# Patient Record
Sex: Male | Born: 2000 | Marital: Single | Smoking: Never smoker
Health system: Southern US, Community
[De-identification: ages and names within clinical notes are randomized; demographics above are authoritative.]

## PROBLEM LIST (undated history)

## (undated) DIAGNOSIS — R569 Unspecified convulsions: Secondary | ICD-10-CM

## (undated) DIAGNOSIS — F840 Autistic disorder: Secondary | ICD-10-CM

## (undated) DIAGNOSIS — G43909 Migraine, unspecified, not intractable, without status migrainosus: Secondary | ICD-10-CM

## (undated) DIAGNOSIS — F32A Depression, unspecified: Secondary | ICD-10-CM

## (undated) HISTORY — DX: Unspecified convulsions: R56.9

## (undated) HISTORY — DX: Migraine, unspecified, not intractable, without status migrainosus: G43.909

## (undated) HISTORY — DX: Autistic disorder: F84.0

## (undated) HISTORY — PX: TYMPANOSTOMY TUBE PLACEMENT: SHX32

## (undated) HISTORY — PX: OTHER SURGICAL HISTORY: SHX169

## (undated) HISTORY — PX: CIRCUMCISION: SHX1350

## (undated) HISTORY — DX: Depression, unspecified: F32.A

---
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
## 2022-08-08 ENCOUNTER — Ambulatory Visit (INDEPENDENT_AMBULATORY_CARE_PROVIDER_SITE_OTHER): Payer: Medicaid Other | Admitting: Nurse Practitioner

## 2022-08-08 ENCOUNTER — Encounter: Payer: Self-pay | Admitting: Nurse Practitioner

## 2022-08-08 VITALS — BP 122/84 | HR 91 | Temp 97.5°F | Ht 66.5 in | Wt 149.1 lb

## 2022-08-08 DIAGNOSIS — R1013 Epigastric pain: Secondary | ICD-10-CM

## 2022-08-08 DIAGNOSIS — Z0001 Encounter for general adult medical examination with abnormal findings: Secondary | ICD-10-CM

## 2022-08-08 DIAGNOSIS — Z13220 Encounter for screening for lipoid disorders: Secondary | ICD-10-CM | POA: Diagnosis not present

## 2022-08-08 DIAGNOSIS — R112 Nausea with vomiting, unspecified: Secondary | ICD-10-CM | POA: Diagnosis not present

## 2022-08-08 DIAGNOSIS — Z0000 Encounter for general adult medical examination without abnormal findings: Secondary | ICD-10-CM

## 2022-08-08 LAB — CBC
HCT: 47.3 % (ref 39.0–52.0)
Hemoglobin: 16.2 g/dL (ref 13.0–17.0)
MCHC: 34.3 g/dL (ref 30.0–36.0)
MCV: 85.6 fl (ref 78.0–100.0)
Platelets: 269 10*3/uL (ref 150.0–400.0)
RBC: 5.52 Mil/uL (ref 4.22–5.81)
RDW: 13.3 % (ref 11.5–14.6)
WBC: 6.6 10*3/uL (ref 4.5–10.5)

## 2022-08-08 LAB — COMPREHENSIVE METABOLIC PANEL
ALT: 11 U/L (ref 0–53)
AST: 18 U/L (ref 0–37)
Albumin: 4.9 g/dL (ref 3.5–5.2)
Alkaline Phosphatase: 137 U/L — ABNORMAL HIGH (ref 39–117)
BUN: 11 mg/dL (ref 6–23)
CO2: 30 mEq/L (ref 19–32)
Calcium: 9.9 mg/dL (ref 8.4–10.5)
Chloride: 102 mEq/L (ref 96–112)
Creatinine, Ser: 1.01 mg/dL (ref 0.40–1.50)
GFR: 107.15 mL/min (ref >60.00)
Glucose, Bld: 83 mg/dL (ref 70–99)
Potassium: 3.9 mEq/L (ref 3.5–5.1)
Sodium: 139 mEq/L (ref 135–145)
Total Bilirubin: 1 mg/dL (ref 0.2–1.2)
Total Protein: 7.9 g/dL (ref 6.0–8.3)

## 2022-08-08 LAB — TSH: TSH: 1.84 u[IU]/mL (ref 0.35–5.50)

## 2022-08-08 LAB — LIPID PANEL
Cholesterol: 178 mg/dL (ref 0–200)
HDL: 35.4 mg/dL — ABNORMAL LOW (ref >39.00)
LDL Cholesterol: 103 mg/dL — ABNORMAL HIGH (ref 0–99)
NonHDL: 142.2
Total CHOL/HDL Ratio: 5
Triglycerides: 197 mg/dL — ABNORMAL HIGH (ref 0.0–149.0)
VLDL: 39.4 mg/dL (ref 0.0–40.0)

## 2022-08-08 LAB — LIPASE: Lipase: 17 U/L (ref 11.0–59.0)

## 2022-08-08 MED ORDER — OMEPRAZOLE 20 MG PO CPDR: 20.0000 mg | DELAYED_RELEASE_CAPSULE | Freq: Every day | ORAL | 3 refills | Status: DC

## 2022-08-08 NOTE — Assessment & Plan Note (Signed)
Ambiguous in nature has improved.  Patient does deal with heartburn and use omeprazole on medication.  Will start patient on omeprazole 20 mg daily for the next 4 months.  Pending labs today

## 2022-08-08 NOTE — Progress Notes (Signed)
New Patient Office Visit  Subjective    Patient ID: Cory Lester, male    DOB: 2001-02-01  Age: 22 y.o. MRN: [id number]  CC:  Chief Complaint  Patient presents with   New Patient (Initial Visit)    HPI Cory Lester presents to establish care  for complete physical and follow up of chronic conditions.   Heartburn: prolsec that he will use intermittently  Immunizations: -Tetanus: Completed in 2016 -Influenza: refused  -Shingles: Too young -Pneumonia: Too young  Diet: Fair diet. 2 meals a day with some snacks. States that he will drink soda and sweet tea.  Exercise:  Walk 3 days a week at least 30 mins.   Eye exam: . Glasses needs updating Dental exam: needs updating   Colonoscopy: Too young, currently average risk Lung Cancer Screening: N/A  PSA: Too young, currently average risk  Sleep: states that when he was in schooll 10-5. Sttes currently it depends on schedule. Feels rested. States that he has snore in the past     H: Home with parents and 3 siblings that are younger E: [employer] for [employer] with [employer] and history. Going ot [employer] school in fall  A: Reading and church and politics D: no illect drugs S:never been on medication has done talk therapy in the past.  Patient states he does have passive SI at times no concrete plan or active thoughts of wanting to harm himself.  Strong family history of mental illness patient plans on enrolling in therapy when he starts graduate school in the fall S: not currenlty sexually acitve  States that he does not drive.  Has the test scheduled in  August   Outpatient Encounter Medications as of 08/08/2022  Medication Sig   omeprazole (PRILOSEC) 20 MG capsule Take 1 capsule (20 mg total) by mouth daily.   No facility-administered encounter medications on file as of 08/08/2022.    Past Medical History:  Diagnosis Date   Autism spectrum disorder    Depression    Migraines    Seizure (HCC)     Past  Surgical History:  Procedure Laterality Date   CIRCUMCISION     OTHER SURGICAL HISTORY     Cyst removed from right side of face   TYMPANOSTOMY TUBE PLACEMENT      Family History  Problem Relation Age of Onset   Depression Mother    Anxiety disorder Mother    Kidney Stones Mother    Mental illness Mother    Learning disabilities Father    Depression Father    Mental illness Father    ADD / ADHD Sister    Depression Brother    Mental illness Brother    Crohn's disease Maternal Aunt    Hypertension Maternal Grandfather    Depression Paternal Grandmother    Alcohol abuse Paternal Grandfather    Depression Paternal Grandfather     Social History   Socioeconomic History   Marital status: Single    Spouse name: Not on file   Number of children: 0   Years of education: Not on file   Highest education level: Bachelor's degree (e.g., BA, AB, BS)  Occupational History   Not on file  Tobacco Use   Smoking status: Never   Smokeless tobacco: Never  Vaping Use   Vaping Use: Never used  Substance and Sexual Activity   Alcohol use: No   Drug use: No   Sexual activity: Never  Other Topics Concern   Not  on file  Social History Narrative   [employer] degree. Law school in fall. [employer]      Lives at home with parents and 3 siblings      Read as a Economist Strain: Not on file  Food Insecurity: Not on file  Transportation Needs: Not on file  Physical Activity: Not on file  Stress: Not on file  Social Connections: Not on file  Intimate Partner Violence: Not on file    Review of Systems  Constitutional:  Negative for chills and fever.  Respiratory:  Negative for shortness of breath.   Cardiovascular:  Negative for chest pain and leg swelling.  Gastrointestinal:  Positive for vomiting (states that he pukes on a weekly basis). Negative for abdominal pain, blood in stool, constipation, diarrhea and nausea.       BM daily    Genitourinary:  Negative for dysuria and hematuria.  Neurological:  Negative for tingling and headaches.  Psychiatric/Behavioral:  Negative for hallucinations and suicidal ideas.         Objective    BP 122/84   Pulse 91   Temp (!) 97.5 F (36.4 C) (Temporal)   Ht 5' 6.5" (1.689 m)   Wt 149 lb 2 oz (67.6 kg)   SpO2 99%   BMI 23.71 kg/m   Physical Exam Vitals and nursing note reviewed. Exam conducted with a chaperone present Cory Lester, CMA).  Constitutional:      Appearance: Normal appearance.  HENT:     Right Ear: Tympanic membrane, ear canal and external ear normal.     Left Ear: Tympanic membrane, ear canal and external ear normal.     Mouth/Throat:     Mouth: Mucous membranes are moist.     Pharynx: Oropharynx is clear.  Eyes:     Extraocular Movements: Extraocular movements intact.     Pupils: Pupils are equal, round, and reactive to light.  Cardiovascular:     Rate and Rhythm: Normal rate and regular rhythm.     Pulses: Normal pulses.     Heart sounds: Normal heart sounds.  Pulmonary:     Effort: Pulmonary effort is normal.     Breath sounds: Normal breath sounds.  Abdominal:     General: Bowel sounds are normal. There is no distension.     Palpations: There is no mass.     Tenderness: There is no abdominal tenderness.     Hernia: No hernia is present. There is no hernia in the left inguinal area or right inguinal area.  Genitourinary:    Penis: Normal.      Testes: Normal.     Epididymis:     Right: Normal.     Left: Normal.  Musculoskeletal:     Right lower leg: No edema.     Left lower leg: No edema.  Lymphadenopathy:     Cervical: No cervical adenopathy.     Lower Body: No right inguinal adenopathy. No left inguinal adenopathy.  Skin:    General: Skin is warm.  Neurological:     General: No focal deficit present.     Mental Status: He is alert.     Deep Tendon Reflexes:     Reflex Scores:      Bicep reflexes are 2+ on the right side and 2+  on the left side.      Patellar reflexes are 2+ on the right side and 2+ on the left side.  Comments: Bilateral upper and lower extremity strength 5/5  Psychiatric:        Mood and Affect: Mood normal.        Behavior: Behavior normal.        Thought Content: Thought content normal.        Judgment: Judgment normal.         Assessment & Plan:   Problem List Items Addressed This Visit       Digestive   Nausea and vomiting    Ambiguous in nature has improved.  Patient does deal with heartburn and use omeprazole on medication.  Will start patient on omeprazole 20 mg daily for the next 4 months.  Pending labs today      Relevant Medications   omeprazole (PRILOSEC) 20 MG capsule   Other Relevant Orders   CBC   Comprehensive metabolic panel   Lipase     Other   Preventative health care - Primary    Discussed age-appropriate immunizations and screening exams.  Did review patient's personal, surgical, social, family histories.  Patient is up-to-date on all age-appropriate immunizations that he would like.  Patient is too young for CRC screening or prostate cancer screening.  Patient is not sexually active so no need for STI testing.  Patient was given information at discharge about preventative healthcare maintenance with anticipatory guidance      Relevant Orders   CBC   Comprehensive metabolic panel   TSH   Epigastric pain    Exam benign in office today.  Will check basic labs inclusive of lipase.  Start patient on omeprazole 20 mg daily for the next 4 months      Relevant Orders   Lipase   Other Visit Diagnoses     Screening for lipid disorders       Relevant Orders   Lipid panel       Return in about 1 year (around 08/08/2023) for CPE and Labs.   Audria Nine, NP

## 2022-08-08 NOTE — Patient Instructions (Signed)
Nice to see you today I will be in touch with the labs once I have reviewed them Follow up with me in 1 year, sooner if you need me  

## 2022-08-08 NOTE — Assessment & Plan Note (Signed)
Exam benign in office today.  Will check basic labs inclusive of lipase.  Start patient on omeprazole 20 mg daily for the next 4 months

## 2022-08-08 NOTE — Assessment & Plan Note (Signed)
Discussed age-appropriate immunizations and screening exams.  Did review patient's personal, surgical, social, family histories.  Patient is up-to-date on all age-appropriate immunizations that he would like.  Patient is too young for CRC screening or prostate cancer screening.  Patient is not sexually active so no need for STI testing.  Patient was given information at discharge about preventative healthcare maintenance with anticipatory guidance

## 2023-07-31 ENCOUNTER — Ambulatory Visit (INDEPENDENT_AMBULATORY_CARE_PROVIDER_SITE_OTHER): Payer: MEDICAID | Admitting: Nurse Practitioner

## 2023-07-31 ENCOUNTER — Encounter: Payer: Self-pay | Admitting: Nurse Practitioner

## 2023-07-31 ENCOUNTER — Ambulatory Visit: Payer: MEDICAID | Admitting: Family Medicine

## 2023-07-31 VITALS — BP 108/80 | HR 92 | Temp 98.2°F | Ht 66.5 in | Wt 152.0 lb

## 2023-07-31 DIAGNOSIS — R112 Nausea with vomiting, unspecified: Secondary | ICD-10-CM

## 2023-07-31 DIAGNOSIS — R1013 Epigastric pain: Secondary | ICD-10-CM

## 2023-07-31 DIAGNOSIS — R252 Cramp and spasm: Secondary | ICD-10-CM

## 2023-07-31 DIAGNOSIS — R519 Headache, unspecified: Secondary | ICD-10-CM

## 2023-07-31 MED ORDER — SUCRALFATE 1 G PO TABS: 1.0000 g | ORAL_TABLET | Freq: Three times a day (TID) | ORAL | 0 refills | Status: DC

## 2023-07-31 NOTE — Assessment & Plan Note (Signed)
 Avoid NSAIDs encouraged tylenol use if needed. Encouraged adequate hydration and nutrition

## 2023-07-31 NOTE — Assessment & Plan Note (Addendum)
 Query anxiety vs stress ulcer. Will r/o H pylori and do basic labs. Avoid nsaids and alcohol. Start Carafate 1g QID. Referral to GI  Has tried and failed omeprazole 

## 2023-07-31 NOTE — Progress Notes (Signed)
 Acute Office Visit  Subjective:     Patient ID: Cory Lester, male    DOB: [birth year], 23 y.o.   MRN: [id number]  Chief Complaint  Patient presents with   Anxiety    Have vomiting spells mainly when traveling but have been frequent lately; a couple of times a week; think I may need referral to [facility]; was taking prilosec but only helped a little so I stopped it    HPI Patient is in today for nausea, vomiting/anxiety.  With a history of The same, headaches, epigastric pain.  Patient has been on omeprazole  daily in the past  Patients grandmother is at bedside and provides some history for the HPI  Of note patient was seen at [facility] student health for vomiting and nausea.  He normally throws up 1-2 times a week but did previous week when he was seen he vomited at least 5 times and been going on for approximately 4 years at that point currently in graduate school for law.  Patient was not given any medication.  They recommended continue omeprazole  daily with a bland diet.  He has been followed by GI in the past but this is when he was considered pediatric.  States that he is throwing up a lot. Approx 1-2 times a week. States that [place] up. States that he has thrown up several times when he is at [employer]. States that he has stopped eating to prevent from vomiting. He has not tried anything over the counter. He has tried omeprazole  that helped some and has been off it for months. He does have some nausea, epigastric pain and then he will vomit.   He does take a lot of advil. He does have daily headaches daily that he will take advil for it to abate it and will get it to go away No alcohol or smoking States that he did pass out approx 1-2 months ago prior to eam.  He will drink sweet tea or grape drink. He does not drink water.   Review of Systems  Constitutional:  Negative for chills and fever.  Respiratory:  Negative for  shortness of breath.   Cardiovascular:  Negative for chest pain.  Gastrointestinal:  Positive for abdominal pain, nausea and vomiting. Negative for constipation and diarrhea.       BM  daily   Neurological:  Positive for dizziness, tingling and headaches. Negative for weakness.        Objective:    BP 108/80   Pulse 92   Temp 98.2 F (36.8 C)   Ht 5' 6.5 (1.689 m)   Wt 152 lb (68.9 kg)   SpO2 98%   BMI 24.17 kg/m  BP Readings from Last 3 Encounters:  07/31/23 108/80  08/08/22 122/84  01/05/18 (!) 110/64 (42%, Z = -0.20 /  50%, Z = 0.00)*   *BP percentiles are based on the 2017 AAP Clinical Practice Guideline for boys   Wt Readings from Last 3 Encounters:  07/31/23 152 lb (68.9 kg)  08/08/22 149 lb 2 oz (67.6 kg)  01/05/18 129 lb 6.4 oz (58.7 kg) (45%, Z= -0.13)*   * Growth percentiles are based on CDC (Boys, 2-20 Years) data.   SpO2 Readings from Last 3 Encounters:  07/31/23 98%  08/08/22 99%  08/11/16 100%      Physical Exam Vitals and nursing note reviewed.  Constitutional:      Appearance: Normal appearance.  HENT:     Mouth/Throat:     Mouth: Mucous membranes are moist.     Pharynx: Oropharynx is clear.   Eyes:     Extraocular Movements: Extraocular movements intact.     Pupils: Pupils are equal, round, and reactive to light.   Neck:     Vascular: No carotid bruit.   Cardiovascular:     Rate and Rhythm: Normal rate and regular rhythm.     Heart sounds: Normal heart sounds.  Pulmonary:     Effort: Pulmonary effort is normal.     Breath sounds: Normal breath sounds.  Abdominal:     General: There is no distension.     Palpations: Abdomen is soft. There is no mass.     Tenderness: There is abdominal tenderness in the epigastric area.     Hernia: No hernia is present.  Lymphadenopathy:     Cervical: No cervical adenopathy.   Neurological:     Mental Status: He is alert.     No results found for any visits on 07/31/23.      Assessment &  Plan:   Problem List Items Addressed This Visit       Digestive   Nausea and vomiting - Primary   Query anxiety vs stress ulcer. Will r/o H pylori and do basic labs. Avoid nsaids and alcohol. Start Carafate 1g QID      Relevant Orders   CBC   Comprehensive metabolic panel with GFR   Ambulatory referral to Gastroenterology     Other   Chronic daily headache   Avoid NSAIDs encouraged tylenol use if needed. Encouraged adequate hydration and nutrition       Relevant Orders   TSH   Epigastric pain   Query anxiety vs stress ulcer. Will r/o H pylori and do basic labs. Avoid nsaids and alcohol. Start Carafate 1g QID. Referral to GI  Has tried and failed omeprazole        Relevant Medications   sucralfate (CARAFATE) 1 g tablet   Other Relevant Orders   CBC   Comprehensive metabolic panel with GFR   H. pylori breath test   Ambulatory referral to Gastroenterology   Other Visit Diagnoses       Muscle cramping       Relevant Orders   Comprehensive metabolic panel with GFR   TSH   Magnesium       Meds ordered this encounter  Medications   sucralfate (CARAFATE) 1 g tablet    Sig: Take 1 tablet (1 g total) by mouth 4 (four) times daily -  with meals and at bedtime for 10 days.    Dispense:  40 tablet    Refill:  0    Supervising Provider:   Deri Fleet A [[id number]]    Return in about 2 weeks (around 08/14/2023) for Anxiety/episgastric pain .  Cory Shay, NP

## 2023-07-31 NOTE — Patient Instructions (Signed)
 Nice to see you today Avoid NSAIDS like Ibuprofen, Motrin, Aleve, Naproxen, BC/Goody powders   Tylenol is ok to take  Drink 64 oz of water a day

## 2023-07-31 NOTE — Assessment & Plan Note (Signed)
 Query anxiety vs stress ulcer. Will r/o H pylori and do basic labs. Avoid nsaids and alcohol. Start Carafate 1g QID

## 2023-08-01 LAB — CBC
HCT: 43.7 % (ref 38.5–50.0)
Hemoglobin: 15.2 g/dL (ref 13.2–17.1)
MCH: 30.4 pg (ref 27.0–33.0)
MCHC: 34.8 g/dL (ref 32.0–36.0)
MCV: 87.4 fL (ref 80.0–100.0)
MPV: 10.4 fL (ref 7.5–12.5)
Platelets: 264 10*3/uL (ref 140–400)
RBC: 5 10*6/uL (ref 4.20–5.80)
RDW: 12.5 % (ref 11.0–15.0)
WBC: 6.6 10*3/uL (ref 3.8–10.8)

## 2023-08-01 LAB — COMPREHENSIVE METABOLIC PANEL WITH GFR
AG Ratio: 1.7 (calc) (ref 1.0–2.5)
ALT: 10 U/L (ref 9–46)
AST: 15 U/L (ref 10–40)
Albumin: 4.6 g/dL (ref 3.6–5.1)
Alkaline phosphatase (APISO): 113 U/L (ref 36–130)
BUN: 12 mg/dL (ref 7–25)
CO2: 26 mmol/L (ref 20–32)
Calcium: 9.4 mg/dL (ref 8.6–10.3)
Chloride: 102 mmol/L (ref 98–110)
Creat: 0.95 mg/dL (ref 0.60–1.24)
Globulin: 2.7 g/dL (ref 1.9–3.7)
Glucose, Bld: 75 mg/dL (ref 65–99)
Potassium: 3.9 mmol/L (ref 3.5–5.3)
Sodium: 139 mmol/L (ref 135–146)
Total Bilirubin: 0.8 mg/dL (ref 0.2–1.2)
Total Protein: 7.3 g/dL (ref 6.1–8.1)
eGFR: 117 mL/min/{1.73_m2} (ref >=60)

## 2023-08-01 LAB — TSH: TSH: 0.98 m[IU]/L (ref 0.40–4.50)

## 2023-08-01 LAB — MAGNESIUM: Magnesium: 1.9 mg/dL (ref 1.5–2.5)

## 2023-08-02 LAB — H. PYLORI BREATH TEST: H. pylori Breath Test: NOT DETECTED

## 2023-08-04 ENCOUNTER — Ambulatory Visit: Payer: Self-pay | Admitting: Nurse Practitioner

## 2023-08-11 ENCOUNTER — Ambulatory Visit (INDEPENDENT_AMBULATORY_CARE_PROVIDER_SITE_OTHER): Payer: MEDICAID | Admitting: Nurse Practitioner

## 2023-08-11 ENCOUNTER — Encounter: Payer: Self-pay | Admitting: Nurse Practitioner

## 2023-08-11 VITALS — BP 106/78 | HR 88 | Temp 98.6°F | Ht 66.5 in | Wt 153.0 lb

## 2023-08-11 DIAGNOSIS — F411 Generalized anxiety disorder: Secondary | ICD-10-CM

## 2023-08-11 DIAGNOSIS — F332 Major depressive disorder, recurrent severe without psychotic features: Secondary | ICD-10-CM | POA: Insufficient documentation

## 2023-08-11 DIAGNOSIS — R112 Nausea with vomiting, unspecified: Secondary | ICD-10-CM

## 2023-08-11 NOTE — Assessment & Plan Note (Signed)
 PHQ-9 administered in office.  And strongly encouraged patient on anxiolytic he politely declined.  Drug of choice would have been Effexor to help with anxiety, depression, headaches.

## 2023-08-11 NOTE — Patient Instructions (Addendum)
 Nice to see you today  [facility] behavioral health urgent care   Address: [address], [place], [place] [address] Phone: [phone] Hours:  Open 24 hours   988 is a Restaurant manager, fast food with resources  Local emergency departments can also get you immediate help

## 2023-08-11 NOTE — Assessment & Plan Note (Signed)
 Patient does have major depressive disorder.  With passive SI.  Patient did contract for safety in office.  Asked several times for patient to go on antidepressant he is reluctant.  Information printed.  Urgent referral to therapy and psychiatry today.  Patient to reach out to behavioral urgent care, [phone], local emergency departments or me if he starts having active SI.  Will reach out to make sure patient has been contacted by referrals.

## 2023-08-11 NOTE — Progress Notes (Signed)
 Established Patient Office Visit  Subjective   Patient ID: Cory Lester, male    DOB: 2001-01-25  Age: 23 y.o. MRN: [id number]  Chief Complaint  Patient presents with   Medical Management of Chronic Issues    Follow up did not notice any change with Carafate . Denies any new symptoms.     HPI  Nausea vomiting/epigastric pain: Patient was seen by me on 07/31/2023.  Patient was placed on Carafate  1 g 4 times daily for 10 days.  Patient had tried and failed omeprazole  in the past.  We did rule out H. pylori.  Basic labs were also negative.  Patient here for follow-up. States that he did not notice much of a differnece. States that [place] and vomited twice. States that the rest of the week he did not vomit. State that he was in bed for the weekend because he felt sick. States that he started feeling better yesterday States that his appetitie is normal, minus the one day.    MDD/GAD: the discussion last week was in regards to anxiety being a part of the stomach issue.  Patient did fill out PHQ-9 and GAD-7 today.  Offered to place patient on anxiolytic patient's t trepidations.  States his mother has been on medications in the past and from the sounds of it did not tolerate them well.  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  Patient states he has done therapy in the past which was beneficial and is interested in doing that.  He does have passive SI but no active SI currently.  He has had that in the past.  No concrete plan.      Review of Systems  Constitutional:  Negative for chills and fever.  Respiratory:  Negative for shortness of breath.   Cardiovascular:  Negative for chest pain.  Gastrointestinal:  Positive for abdominal pain and vomiting.  Neurological:  Negative for headaches.  Psychiatric/Behavioral:  Positive for suicidal ideas (passive). Negative for hallucinations.       Objective:     BP 106/78   Pulse 88   Temp 98.6 F  (37 C) (Oral)   Ht 5' 6.5 (1.689 m)   Wt 153 lb (69.4 kg)   SpO2 98%   BMI 24.32 kg/m  BP Readings from Last 3 Encounters:  08/11/23 106/78  07/31/23 108/80  08/08/22 122/84   Wt Readings from Last 3 Encounters:  08/11/23 153 lb (69.4 kg)  07/31/23 152 lb (68.9 kg)  08/08/22 149 lb 2 oz (67.6 kg)   SpO2 Readings from Last 3 Encounters:  08/11/23 98%  07/31/23 98%  08/08/22 99%      Physical Exam Vitals and nursing note reviewed.  Constitutional:      Appearance: Normal appearance.   Cardiovascular:     Rate and Rhythm: Normal rate and regular rhythm.     Heart sounds: Normal heart sounds.  Pulmonary:     Effort: Pulmonary effort is normal.     Breath sounds: Normal breath sounds.   Neurological:     Mental Status: He is alert.      No results found for any visits on 08/11/23.    The ASCVD Risk score (Arnett DK, et al., 2019) failed to calculate for the following reasons:   The 2019 ASCVD risk score is only valid for ages 7 to 87    Assessment & Plan:   Problem List Items Addressed This Visit  Digestive   Nausea and vomiting   Patient to continue with plan of seeing GI provider.  Patient is not having any weight loss and appetite is normal per his report.        Other   GAD (generalized anxiety disorder)   PHQ-9 administered in office.  And strongly encouraged patient on anxiolytic he politely declined.  Drug of choice would have been Effexor to help with anxiety, depression, headaches.      Relevant Orders   Ambulatory referral to Psychology   Ambulatory referral to Psychiatry   Severe episode of recurrent major depressive disorder, without psychotic features (HCC) - Primary   Patient does have major depressive disorder.  With passive SI.  Patient did contract for safety in office.  Asked several times for patient to go on antidepressant he is reluctant.  Information printed.  Urgent referral to therapy and psychiatry today.  Patient to  reach out to behavioral urgent care, [phone], local emergency departments or me if he starts having active SI.  Will reach out to make sure patient has been contacted by referrals.      Relevant Orders   Ambulatory referral to Psychology   Ambulatory referral to Psychiatry    Return if symptoms worsen or fail to improve.    Adina Crandall, NP

## 2023-08-11 NOTE — Assessment & Plan Note (Signed)
 Patient to continue with plan of seeing GI provider.  Patient is not having any weight loss and appetite is normal per his report.

## 2023-08-20 ENCOUNTER — Telehealth: Payer: Self-pay | Admitting: Nurse Practitioner

## 2023-08-20 NOTE — Telephone Encounter (Signed)
 Left voicemail for patient to call the office back.

## 2023-08-20 NOTE — Telephone Encounter (Signed)
-----   Message from [facility] sent at 08/11/2023  3:49 PM EDT ----- Regarding: MDD/GAD/CPE Call and see how patient is doing.  Has he considered Effexor.  Can get patient in the next 30 days for CPE

## 2023-08-20 NOTE — Telephone Encounter (Signed)
 Call and see how patient is doing.  Has he considered Effexor.  Can we get the patient in the next 30 days for CPE

## 2023-08-21 ENCOUNTER — Telehealth: Payer: Self-pay | Admitting: Nurse Practitioner

## 2023-08-21 ENCOUNTER — Ambulatory Visit (INDEPENDENT_AMBULATORY_CARE_PROVIDER_SITE_OTHER): Payer: MEDICAID | Admitting: Clinical

## 2023-08-21 DIAGNOSIS — F411 Generalized anxiety disorder: Secondary | ICD-10-CM | POA: Diagnosis not present
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
## 2023-08-21 NOTE — Telephone Encounter (Signed)
 PLEASE ADVISE.   Copied from CRM [id number]. Topic: Appointments - Scheduling Inquiry for Clinic >> Aug 20, 2023  3:13 PM Martinique E wrote: Reason for CRM: Tried getting patient scheduled for his CPE within the next 30 days and it was pushing agent out until December 2025 with PCP. Callback number [phone].

## 2023-08-21 NOTE — Telephone Encounter (Signed)
 Left voicemail for patient to call back and schedule CPE office visit.

## 2023-08-25 ENCOUNTER — Telehealth: Payer: Self-pay | Admitting: Nurse Practitioner

## 2023-08-25 NOTE — Telephone Encounter (Signed)
 Left voicemail for patient to call the office back.

## 2023-08-25 NOTE — Telephone Encounter (Signed)
-----   Message from [id number] sent at 08/11/2023  3:39 PM EDT ----- Regarding: MDD/GAD See how he is doing.  Is he willing to do the effexor Has he heard for therapy and psych

## 2023-08-25 NOTE — Telephone Encounter (Signed)
 See how he is doing.  Is he willing to do the effexor  Has he heard for therapy and psych

## 2023-08-28 ENCOUNTER — Ambulatory Visit: Payer: MEDICAID | Admitting: Clinical

## 2023-08-28 DIAGNOSIS — F411 Generalized anxiety disorder: Secondary | ICD-10-CM | POA: Diagnosis not present

## 2023-08-28 NOTE — Progress Notes (Signed)
 Time: 3:00pm-4:00pm Diagnosis: F41.1 CPT Code: 90837P  Cory Lester was seen in person for individual therapy. Session began by creation of a treatment plan. Cory Lester provided verbal input and verbally consented to all goals and interventions. He reported a reduction in suicidal ideation over the past week due to being engaged in and excited about a Human resources officer. He anticipates this will continue through next week. He is scheduled to be seen again in one week.  Treatment Plan Client Abilities/Strengths Cory Lester attends appointments regularly and is able to articulate thoughts and experiences clearly. Client Treatment Preferences  Cory Lester prefers in-person appointments and will need appointments that fit with his school schedule.  Client Statement of Needs  Cory Lester reported that he needs help to manage suicidal ideation, and symptoms of depression and anxiety. Treatment Level  Weekly with reduced frequency as progress is made. Symptoms  Vomiting, suicidal ideation, feelings of sadness, loss of pleasure, lack of motivation, poor self image, inconsistent appetite (sometimes so sad he does not eat), fluctuations in sleep, stomach pain, sporadic leg pain, headaches several times weekly (takes prescription medication for this)  Problems Addressed  Suicidal ideation, symptoms of depression and anxiety Goals 1. Cory Lester would like to improve his overall mood   Objective Reduce frequency and intensity of suicidal ideation Target Date: 08/27/2024 Frequency: Weekly  Progress: 0 Modality: individual  Related Interventions Therapist will help Cory Lester to identify and disengage from negative thought patterns using CBT-based strategies Therapist will provide Cory Lester with opportunities to process her experiences in session Therapist will provide Cory Lester with additional resources as appropriate Therapist will provide Cory Lester with strategies to help her manage her emotions, such as deep breathing, meditation,  mindfulness, and self care Therapist will engage Cory Lester in behavior activation, including pleasant events scheduling, incorporation of structure into his routine, and mastery experiences              Cory LITTIE Ponto, PhD

## 2023-09-02 ENCOUNTER — Ambulatory Visit (INDEPENDENT_AMBULATORY_CARE_PROVIDER_SITE_OTHER): Payer: MEDICAID | Admitting: Clinical

## 2023-09-02 DIAGNOSIS — F411 Generalized anxiety disorder: Secondary | ICD-10-CM

## 2023-09-02 NOTE — Progress Notes (Signed)
 Time: 2:00pm-3:00pm Diagnosis: F41.1 CPT Code: 90837P  Lea was seen in person for individual therapy. He reported a more challenging week, but noted he had not experienced suicidal ideation. He reflected upon a sense that he is outside of society. Therapist engaged him in discussion of this, exploring times when he has felt more connected. He shared that he has felt this way much of his life. For homework, he will explore whether there are political boards/committees/roles he can get involved in at the state level. He is scheduled to be seen again in one week.  Treatment Plan Client Abilities/Strengths Hubbard attends appointments regularly and is able to articulate thoughts and experiences clearly. Client Treatment Preferences  Pasha prefers in-person appointments and will need appointments that fit with his school schedule.  Client Statement of Needs  Rudolph reported that he needs help to manage suicidal ideation, and symptoms of depression and anxiety. Treatment Level  Weekly with reduced frequency as progress is made. Symptoms  Vomiting, suicidal ideation, feelings of sadness, loss of pleasure, lack of motivation, poor self image, inconsistent appetite (sometimes so sad he does not eat), fluctuations in sleep, stomach pain, sporadic leg pain, headaches several times weekly (takes prescription medication for this)  Problems Addressed  Suicidal ideation, symptoms of depression and anxiety Goals 1. Martese would like to improve his overall mood   Objective Reduce frequency and intensity of suicidal ideation Target Date: 08/27/2024 Frequency: Weekly  Progress: 0 Modality: individual  Related Interventions Therapist will help Jaquann to identify and disengage from negative thought patterns using CBT-based strategies Therapist will provide Daanish with opportunities to process her experiences in session Therapist will provide Priest with additional resources as appropriate Therapist will  provide Margues with strategies to help her manage her emotions, such as deep breathing, meditation, mindfulness, and self care Therapist will engage Tavita in behavior activation, including pleasant events scheduling, incorporation of structure into his routine, and mastery experiences              [name], PhD               [name], PhD

## 2023-09-05 ENCOUNTER — Ambulatory Visit: Payer: MEDICAID | Admitting: Clinical

## 2023-09-08 ENCOUNTER — Ambulatory Visit (INDEPENDENT_AMBULATORY_CARE_PROVIDER_SITE_OTHER): Payer: MEDICAID | Admitting: Clinical

## 2023-09-08 DIAGNOSIS — F411 Generalized anxiety disorder: Secondary | ICD-10-CM | POA: Diagnosis not present

## 2023-09-08 NOTE — Progress Notes (Signed)
 Time: 2:00pm-3:00pm Diagnosis: F41.1 CPT Code: 90837P  Cory Lester was seen in person for individual therapy. Session began by processing a request Cory Lester had made at the end of his last session that the therapist ask more questions. He communicated that he would like to better understand the nature of his current challenges. He expressed interest in having the therapist read results from his previous evaluation, as well as in completing an ABAS-3 and BDI. He is scheduled to be seen again in one week.  Treatment Plan Client Abilities/Strengths Cory Lester attends appointments regularly and is able to articulate thoughts and experiences clearly. Client Treatment Preferences  Cory Lester prefers in-person appointments and will need appointments that fit with his school schedule.  Client Statement of Needs  Cory Lester reported that he needs help to manage suicidal ideation, and symptoms of depression and anxiety. Treatment Level  Weekly with reduced frequency as progress is made. Symptoms  Vomiting, suicidal ideation, feelings of sadness, loss of pleasure, lack of motivation, poor self image, inconsistent appetite (sometimes so sad he does not eat), fluctuations in sleep, stomach pain, sporadic leg pain, headaches several times weekly (takes prescription medication for this)  Problems Addressed  Suicidal ideation, symptoms of depression and anxiety Goals 1. Cory Lester would like to improve his overall mood   Objective Reduce frequency and intensity of suicidal ideation Target Date: 08/27/2024 Frequency: Weekly  Progress: 0 Modality: individual  Related Interventions Therapist will help Cory Lester to identify and disengage from negative thought patterns using CBT-based strategies Therapist will provide Cory Lester with opportunities to process her experiences in session Therapist will provide Cory Lester with additional resources as appropriate Therapist will provide Cory Lester with strategies to help her manage her emotions, such  as deep breathing, meditation, mindfulness, and self care Therapist will engage Cory Lester in behavior activation, including pleasant events scheduling, incorporation of structure into his routine, and mastery experiences                Cory LITTIE Ponto, PhD               Cory LITTIE Ponto, PhD

## 2023-09-18 ENCOUNTER — Ambulatory Visit (INDEPENDENT_AMBULATORY_CARE_PROVIDER_SITE_OTHER): Payer: MEDICAID | Admitting: Clinical

## 2023-09-18 DIAGNOSIS — F411 Generalized anxiety disorder: Secondary | ICD-10-CM

## 2023-09-18 NOTE — Progress Notes (Signed)
 Time: 10:00am-11:00am Diagnosis: F41.1 CPT Code: 90837P  Ardean was seen in person for individual therapy. Session focused on reviewing Kizer's ABAS-3 scores and completing the BDI. He obtained a score of 32, placing him in the severe range. Therapist discussed the idea of a more intensive option, including partial hospitalization, and he indicated a plan to consider this. Suicidal ideation and intent are present but Jaylen assured the therapist he will not act on these thoughts due to religious beliefs, and is safe. He is scheduled to be seen again in one week.  Treatment Plan Client Abilities/Strengths Nicolo attends appointments regularly and is able to articulate thoughts and experiences clearly. Client Treatment Preferences  Gunnar prefers in-person appointments and will need appointments that fit with his school schedule.  Client Statement of Needs  Ramadan reported that he needs help to manage suicidal ideation, and symptoms of depression and anxiety. Treatment Level  Weekly with reduced frequency as progress is made. Symptoms  Vomiting, suicidal ideation, feelings of sadness, loss of pleasure, lack of motivation, poor self image, inconsistent appetite (sometimes so sad he does not eat), fluctuations in sleep, stomach pain, sporadic leg pain, headaches several times weekly (takes prescription medication for this)  Problems Addressed  Suicidal ideation, symptoms of depression and anxiety Goals 1. Barlow would like to improve his overall mood   Objective Reduce frequency and intensity of suicidal ideation Target Date: 08/27/2024 Frequency: Weekly  Progress: 0 Modality: individual  Related Interventions Therapist will help Jeorge to identify and disengage from negative thought patterns using CBT-based strategies Therapist will provide Fabyan with opportunities to process her experiences in session Therapist will provide Lopez with additional resources as appropriate Therapist will  provide Talis with strategies to help her manage her emotions, such as deep breathing, meditation, mindfulness, and self care Therapist will engage Watson in behavior activation, including pleasant events scheduling, incorporation of structure into his routine, and mastery experiences               [name], PhD               [name], PhD

## 2023-09-24 ENCOUNTER — Ambulatory Visit (INDEPENDENT_AMBULATORY_CARE_PROVIDER_SITE_OTHER): Payer: MEDICAID | Admitting: Clinical

## 2023-09-24 DIAGNOSIS — F411 Generalized anxiety disorder: Secondary | ICD-10-CM

## 2023-09-24 NOTE — Progress Notes (Signed)
 Time: 2:00 pm-3:00 pm Diagnosis: F41.1 CPT Code: 09162E  Cory Lester was seen in person for individual therapy. Session included reviewing results from his previous evaluation and considering his next best steps. Cory Lester was hesitant at the idea of exploring more intensive treatment for depression. He was open to creating a plan for the next 8 weeks and pursuing more intensive treatment if BDI scores do not decrease. He assured the therapist that he will be safe and will call 911 or go to the emergency room if thoughts of self-harm worsen. He continues to endorse concern about going to hell as a primary barrier. He is scheduled to be seen again in three weeks.  Therapy Agreement Twice weekly therapy appointments  Continue with political interest on a regular basis  I agree to go to the emergency room or call 911 if thoughts of self harm worsen Continue recreational reading on a regular basis Go for regular walks If symptoms do not improve in 8 weeks as evidenced by a decreased BDI score, we will pursue more intensive treatment     Treatment Plan Client Abilities/Strengths Cory Lester attends appointments regularly and is able to articulate thoughts and experiences clearly. Client Treatment Preferences  Cory Lester prefers in-person appointments and will need appointments that fit with his school schedule.  Client Statement of Needs  Cory Lester reported that he needs help to manage suicidal ideation, and symptoms of depression and anxiety. Treatment Level  Weekly with reduced frequency as progress is made. Symptoms  Vomiting, suicidal ideation, feelings of sadness, loss of pleasure, lack of motivation, poor self image, inconsistent appetite (sometimes so sad he does not eat), fluctuations in sleep, stomach pain, sporadic leg pain, headaches several times weekly (takes prescription medication for this)  Problems Addressed  Suicidal ideation, symptoms of depression and anxiety Goals 1. Cory Lester would like to  improve his overall mood   Objective Reduce frequency and intensity of suicidal ideation Target Date: 08/27/2024 Frequency: Weekly  Progress: 0 Modality: individual  Related Interventions Therapist will help Cory Lester to identify and disengage from negative thought patterns using CBT-based strategies Therapist will provide Cory Lester with opportunities to process her experiences in session Therapist will provide Cory Lester with additional resources as appropriate Therapist will provide Cory Lester with strategies to help her manage her emotions, such as deep breathing, meditation, mindfulness, and self care Therapist will engage Cory Lester in behavior activation, including pleasant events scheduling, incorporation of structure into his routine, and mastery experiences               [name], PhD               [name], PhD               [name], PhD

## 2023-09-25 ENCOUNTER — Encounter: Payer: Self-pay | Admitting: Gastroenterology

## 2023-10-14 ENCOUNTER — Ambulatory Visit (INDEPENDENT_AMBULATORY_CARE_PROVIDER_SITE_OTHER): Payer: MEDICAID | Admitting: Clinical

## 2023-10-14 DIAGNOSIS — F411 Generalized anxiety disorder: Secondary | ICD-10-CM

## 2023-10-14 NOTE — Progress Notes (Signed)
 Time: 4:00 pm-5:00 pm Diagnosis: F41.1 CPT Code: 09162E  Cory Lester was seen in person for individual therapy. He reported an increase in depressed mood and suicidal ideation with intent but no plan. Religious beliefs continue to serve as his primary barrier. He had called [facility] and learned that they do not accept his insurance. He indicated a plan to begin receiving additional services through his university's counseling center, with an appointment in two days. He agreed to go to the emergency room if symptoms worsen. Therapist suggested exploring medication as an option, but he expressed hesitation based on ill effects he has observed in family members. He is scheduled to be seen again in one week, and agreed to reach out if he needs to be seen sooner. He is scheduled to be seen again in three weeks.  Therapy Agreement Twice weekly therapy appointments  Continue with political interest on a regular basis  I agree to go to the emergency room or call 911 if thoughts of self harm worsen Continue recreational reading on a regular basis Go for regular walks If symptoms do not improve in 8 weeks as evidenced by a decreased BDI score, we will pursue more intensive treatment     Treatment Plan Client Abilities/Strengths Praveen attends appointments regularly and is able to articulate thoughts and experiences clearly. Client Treatment Preferences  Chett prefers in-person appointments and will need appointments that fit with his school schedule.  Client Statement of Needs  Dontrel reported that he needs help to manage suicidal ideation, and symptoms of depression and anxiety. Treatment Level  Weekly with reduced frequency as progress is made. Symptoms  Vomiting, suicidal ideation, feelings of sadness, loss of pleasure, lack of motivation, poor self image, inconsistent appetite (sometimes so sad he does not eat), fluctuations in sleep, stomach pain, sporadic leg pain, headaches several times  weekly (takes prescription medication for this)  Problems Addressed  Suicidal ideation, symptoms of depression and anxiety Goals 1. Alontae would like to improve his overall mood   Objective Reduce frequency and intensity of suicidal ideation Target Date: 08/27/2024 Frequency: Weekly  Progress: 0 Modality: individual  Related Interventions Therapist will help Roemello to identify and disengage from negative thought patterns using CBT-based strategies Therapist will provide Freddrick with opportunities to process her experiences in session Therapist will provide Chanel with additional resources as appropriate Therapist will provide Bryten with strategies to help her manage her emotions, such as deep breathing, meditation, mindfulness, and self care Therapist will engage Korey in behavior activation, including pleasant events scheduling, incorporation of structure into his routine, and mastery experiences               [name], PhD                    [name], PhD               [name], PhD

## 2023-10-21 ENCOUNTER — Ambulatory Visit (INDEPENDENT_AMBULATORY_CARE_PROVIDER_SITE_OTHER): Payer: MEDICAID | Admitting: Clinical

## 2023-10-21 DIAGNOSIS — F411 Generalized anxiety disorder: Secondary | ICD-10-CM | POA: Diagnosis not present

## 2023-10-21 NOTE — Progress Notes (Signed)
 Time: 2:00 pm-3:00 pm Diagnosis: F41.1 CPT Code: 09162E  Cory Lester was seen in person for individual therapy. He reported some improvement in mood, but suicidal ideation without plan or intent continues in the mornings and evenings. He reported having completed his homework (see agreement below), and had started a petition at school, as as initiated the process to join the state board. Session focused on ways to connect with others. He is scheduled to be seen again in one week, and has an appointment with his school counseling center in two days.  Therapy Agreement Twice weekly therapy appointments  Continue with political interest on a regular basis  I agree to go to the emergency room or call 911 if thoughts of self harm worsen Continue recreational reading on a regular basis Go for regular walks If symptoms do not improve in 8 weeks as evidenced by a decreased BDI score, we will pursue more intensive treatment     Treatment Plan Client Abilities/Strengths Cory Lester attends appointments regularly and is able to articulate thoughts and experiences clearly. Client Treatment Preferences  Cory Lester prefers in-person appointments and will need appointments that fit with his school schedule.  Client Statement of Needs  Cory Lester reported that he needs help to manage suicidal ideation, and symptoms of depression and anxiety. Treatment Level  Weekly with reduced frequency as progress is made. Symptoms  Vomiting, suicidal ideation, feelings of sadness, loss of pleasure, lack of motivation, poor self image, inconsistent appetite (sometimes so sad he does not eat), fluctuations in sleep, stomach pain, sporadic leg pain, headaches several times weekly (takes prescription medication for this)  Problems Addressed  Suicidal ideation, symptoms of depression and anxiety Goals 1. Cory Lester would like to improve his overall mood   Objective Reduce frequency and intensity of suicidal ideation Target Date: 08/27/2024  Frequency: Weekly  Progress: 0 Modality: individual  Related Interventions Therapist will help Cory Lester to identify and disengage from negative thought patterns using CBT-based strategies Therapist will provide Cory Lester with opportunities to process her experiences in session Therapist will provide Cory Lester with additional resources as appropriate Therapist will provide Cory Lester with strategies to help her manage her emotions, such as deep breathing, meditation, mindfulness, and self care Therapist will engage [name] in behavior activation, including pleasant events scheduling, incorporation of structure into his routine, and mastery experiences               Cory LITTIE Ponto, PhD                      Cory LITTIE Ponto, PhD               Cory LITTIE Ponto, PhD

## 2023-10-28 ENCOUNTER — Ambulatory Visit (INDEPENDENT_AMBULATORY_CARE_PROVIDER_SITE_OTHER): Payer: MEDICAID | Admitting: Clinical

## 2023-10-28 DIAGNOSIS — F411 Generalized anxiety disorder: Secondary | ICD-10-CM

## 2023-10-28 NOTE — Progress Notes (Signed)
 Time: 8:00 am-9:00 am Diagnosis: F41.1 CPT Code: 90837P  Cory Lester was seen in person for individual therapy. He reported continued improvement in mood, and some reduction in frequency of suicidal ideation. However, he continues to experience suicidal ideation without plan or intent daily. Session focused on his concerns about continuing his family, and [name] connected his suicidal ideation to fears that he will not able to do so. Therapist encouraged him to reflect further upon his desire to his family and his personal characteristics to continue. Therapist also suggested consulting with his PCP about taking fish oil for mood regulation and to mitigate the risk of psychotic symptoms. He is scheduled to be seen again in one week.   Therapy Agreement Twice weekly therapy appointments  Continue with political interest on a regular basis  I agree to go to the emergency room or call 911 if thoughts of self harm worsen Continue recreational reading on a regular basis Go for regular walks If symptoms do not improve in 8 weeks as evidenced by a decreased BDI score, we will pursue more intensive treatment     Treatment Plan Client Abilities/Strengths [name] attends appointments regularly and is able to articulate thoughts and experiences clearly. Client Treatment Preferences  [name] prefers in-person appointments and will need appointments that fit with his school schedule.  Client Statement of Needs  [name] reported that he needs help to manage suicidal ideation, and symptoms of depression and anxiety. Treatment Level  Weekly with reduced frequency as progress is made. Symptoms  Vomiting, suicidal ideation, feelings of sadness, loss of pleasure, lack of motivation, poor self image, inconsistent appetite (sometimes so sad he does not eat), fluctuations in sleep, stomach pain, sporadic leg pain, headaches several times weekly (takes prescription medication for this)  Problems Addressed  Suicidal  ideation, symptoms of depression and anxiety Goals 1. [name] would like to improve his overall mood   Objective Reduce frequency and intensity of suicidal ideation Target Date: 08/27/2024 Frequency: Weekly  Progress: 0 Modality: individual  Related Interventions Therapist will help [name] to identify and disengage from negative thought patterns using CBT-based strategies Therapist will provide Job with opportunities to process her experiences in session Therapist will provide [name] with additional resources as appropriate Therapist will provide [name] with strategies to help her manage her emotions, such as deep breathing, meditation, mindfulness, and self care Therapist will engage [name] in behavior activation, including pleasant events scheduling, incorporation of structure into his routine, and mastery experiences               [name], PhD                     [name], PhD               [name], PhD

## 2023-11-04 ENCOUNTER — Ambulatory Visit: Payer: MEDICAID | Admitting: Clinical

## 2023-11-04 DIAGNOSIS — F411 Generalized anxiety disorder: Secondary | ICD-10-CM | POA: Diagnosis not present

## 2023-11-04 NOTE — Progress Notes (Signed)
 Time: 9:00 am-9:59 am Diagnosis: F41.1 CPT Code: 90837P  Ashtian was seen in person for individual therapy. He reported continued reduction in suicidal ideation, and several successes over the past week. Therapist continued to explore his outlook for the future and thought patterns with him, and he was open to the idea that he may have a tendency to expect the worst for fear of failure.He is scheduled to be seen again in two weeks and therapist will reach out as cancellations happen..   Therapy Agreement Twice weekly therapy appointments  Continue with political interest on a regular basis  I agree to go to the emergency room or call 911 if thoughts of self harm worsen Continue recreational reading on a regular basis Go for regular walks If symptoms do not improve in 8 weeks as evidenced by a decreased BDI score, we will pursue more intensive treatment     Treatment Plan Client Abilities/Strengths Shubham attends appointments regularly and is able to articulate thoughts and experiences clearly. Client Treatment Preferences  Tanmay prefers in-person appointments and will need appointments that fit with his school schedule.  Client Statement of Needs  Markevion reported that he needs help to manage suicidal ideation, and symptoms of depression and anxiety. Treatment Level  Weekly with reduced frequency as progress is made. Symptoms  Vomiting, suicidal ideation, feelings of sadness, loss of pleasure, lack of motivation, poor self image, inconsistent appetite (sometimes so sad he does not eat), fluctuations in sleep, stomach pain, sporadic leg pain, headaches several times weekly (takes prescription medication for this)  Problems Addressed  Suicidal ideation, symptoms of depression and anxiety Goals 1. Oronde would like to improve his overall mood   Objective Reduce frequency and intensity of suicidal ideation Target Date: 08/27/2024 Frequency: Weekly  Progress: 0 Modality: individual   Related Interventions Therapist will help Eagle to identify and disengage from negative thought patterns using CBT-based strategies Therapist will provide Jerrick with opportunities to process her experiences in session Therapist will provide Marrion with additional resources as appropriate Therapist will provide Kwamaine with strategies to help her manage her emotions, such as deep breathing, meditation, mindfulness, and self care Therapist will engage Crawford in behavior activation, including pleasant events scheduling, incorporation of structure into his routine, and mastery experiences               [name], PhD                          [name], PhD               [name], PhD

## 2023-11-10 NOTE — Progress Notes (Unsigned)
 Chief Complaint:nausea and vomiting, epigastric pain Primary GI Doctor:***  HPI:  Patient is a  23  year old male patient with past medical history of anxiety *****who was referred to me by Wendee Lynwood HERO, NP on 07/31/23 for a evaluation of nausea, vomiting, epigastric pain .    Interval History  Patient admits/denies GERD Patient has been placed on omeprazole  20 mg p.o. daily Patient has been prescribed Carafate  1 g tablet Patient admits/denies dysphagia Patient admits/denies nausea, vomiting, or weight loss  Patient admits/denies altered bowel habits Patient admits/denies abdominal pain Patient admits/denies rectal bleeding   Denies/Admits alcohol Denies/Admits smoking Denies/Admits NSAID use. Patient takes Advil for headaches Denies/Admits they are on blood thinners.  Patients last colonoscopy Patients last EGD  Surgical history:  Patient's family history includes  Wt Readings from Last 3 Encounters:  08/11/23 153 lb (69.4 kg)  07/31/23 152 lb (68.9 kg)  08/08/22 149 lb 2 oz (67.6 kg)      Past Medical History:  Diagnosis Date   Autism spectrum disorder    Depression    Migraines    Seizure (HCC)     Past Surgical History:  Procedure Laterality Date   CIRCUMCISION     OTHER SURGICAL HISTORY     Cyst removed from right side of face   TYMPANOSTOMY TUBE PLACEMENT      Current Outpatient Medications  Medication Sig Dispense Refill   omeprazole  (PRILOSEC) 20 MG capsule Take 1 capsule (20 mg total) by mouth daily. (Patient not taking: Reported on 08/11/2023) 30 capsule 3   sucralfate  (CARAFATE ) 1 g tablet Take 1 tablet (1 g total) by mouth 4 (four) times daily -  with meals and at bedtime for 10 days. 40 tablet 0   No current facility-administered medications for this visit.    Allergies as of 11/11/2023   (No Known Allergies)    Family History  Problem Relation Age of Onset   Depression Mother    Anxiety disorder Mother    Kidney Stones Mother     Mental illness Mother    Learning disabilities Father    Depression Father    Mental illness Father    ADD / ADHD Sister    Depression Brother    Mental illness Brother    Crohn's disease Maternal Aunt    Hypertension Maternal Grandfather    Depression Paternal Grandmother    Alcohol abuse Paternal Grandfather    Depression Paternal Grandfather     Review of Systems:    Constitutional: No weight loss, fever, chills, weakness or fatigue HEENT: Eyes: No change in vision               Ears, Nose, Throat:  No change in hearing or congestion Skin: No rash or itching Cardiovascular: No chest pain, chest pressure or palpitations   Respiratory: No SOB or cough Gastrointestinal: See HPI and otherwise negative Genitourinary: No dysuria or change in urinary frequency Neurological: No headache, dizziness or syncope Musculoskeletal: No new muscle or joint pain Hematologic: No bleeding or bruising Psychiatric: No history of depression or anxiety    Physical Exam:  Vital signs: There were no vitals taken for this visit.  Constitutional:   Pleasant *** male/male appears to be in NAD, Well developed, Well nourished, alert and cooperative Eyes:   PEERL, EOMI. No icterus. Conjunctiva pink. Neck:  Supple Throat: Oral cavity and pharynx without inflammation, swelling or lesion.  Respiratory: Respirations even and unlabored. Lungs clear to auscultation bilaterally.   No  wheezes, crackles, or rhonchi.  Cardiovascular: Normal S1, S2. Regular rate and rhythm. No peripheral edema, cyanosis or pallor.  Gastrointestinal:  Soft, nondistended, nontender. No rebound or guarding. Normal bowel sounds. No appreciable masses or hepatomegaly. Rectal:  Not performed.  Anoscopy: Msk:  Symmetrical without gross deformities. Without edema, no deformity or joint abnormality.  Neurologic:  Alert and  oriented x4;  grossly normal neurologically.  Skin:   Dry and intact without significant lesions or  rashes.  RELEVANT LABS AND IMAGING: CBC    Latest Ref Rng & Units 07/31/2023    3:46 PM 08/08/2022   11:02 AM 08/11/2016   11:11 AM  CBC  WBC 3.8 - 10.8 Thousand/uL 6.6  6.6    Hemoglobin 13.2 - 17.1 g/dL 84.7  83.7  85.3   Hematocrit 38.5 - 50.0 % 43.7  47.3  43.0   Platelets 140 - 400 Thousand/uL 264  269.0       CMP     Latest Ref Rng & Units 07/31/2023    3:46 PM 08/08/2022   11:02 AM 08/11/2016   11:11 AM  CMP  Glucose 65 - 99 mg/dL 75  83  81   BUN 7 - 25 mg/dL 12  11  11    Creatinine 0.60 - 1.24 mg/dL 9.04  8.98  9.39   Sodium 135 - 146 mmol/L 139  139  140   Potassium 3.5 - 5.3 mmol/L 3.9  3.9  3.9   Chloride 98 - 110 mmol/L 102  102  105   CO2 20 - 32 mmol/L 26  30    Calcium 8.6 - 10.3 mg/dL 9.4  9.9    Total Protein 6.1 - 8.1 g/dL 7.3  7.9    Total Bilirubin 0.2 - 1.2 mg/dL 0.8  1.0    Alkaline Phos 39 - 117 U/L  137    AST 10 - 40 U/L 15  18    ALT 9 - 46 U/L 10  11       Lab Results  Component Value Date   TSH 0.98 07/31/2023  07/31/2023 H. pylori breath test negative  Assessment: 1. ***  Plan: 1. ***   Thank you for the courtesy of this consult. Please call me with any questions or concerns.   Alex Leahy, FNP-C [facility] 11/10/2023, 4:08 PM  Cc: Wendee Lynwood HERO, NP

## 2023-11-11 ENCOUNTER — Encounter: Payer: Self-pay | Admitting: Gastroenterology

## 2023-11-11 ENCOUNTER — Ambulatory Visit (INDEPENDENT_AMBULATORY_CARE_PROVIDER_SITE_OTHER): Payer: MEDICAID | Admitting: Gastroenterology

## 2023-11-11 VITALS — BP 116/70 | HR 73 | Ht 66.0 in | Wt 144.2 lb

## 2023-11-11 DIAGNOSIS — R112 Nausea with vomiting, unspecified: Secondary | ICD-10-CM

## 2023-11-11 DIAGNOSIS — R1013 Epigastric pain: Secondary | ICD-10-CM | POA: Diagnosis not present

## 2023-11-11 DIAGNOSIS — R634 Abnormal weight loss: Secondary | ICD-10-CM

## 2023-11-11 DIAGNOSIS — G8929 Other chronic pain: Secondary | ICD-10-CM

## 2023-11-11 MED ORDER — OMEPRAZOLE 20 MG PO CPDR: 20.0000 mg | DELAYED_RELEASE_CAPSULE | Freq: Every day | ORAL | 3 refills | Status: AC

## 2023-11-11 MED ORDER — ONDANSETRON 4 MG PO TBDP: 4.0000 mg | ORAL_TABLET | Freq: Two times a day (BID) | ORAL | 0 refills | Status: AC | PRN

## 2023-11-11 NOTE — Patient Instructions (Signed)
 Nausea and vomiting Ondansetron  as needed for nausea- sent medication to pharmacy Can try ginger chews otc No NSAIDs (advil)- talk with PCP about something for headaches    Abdominal pain Restart omeprazole  20 mg po daily, take 1 tablet 30-45 minutes before breakfast GERD diet, no late meals  You have been scheduled for a CT scan of the abdomen and pelvis at [facility], 1st floor Radiology. You are scheduled on 11/14/23 at 11:00am. You should arrive 15 minutes prior to your appointment time for registration.   You may take any medications as prescribed with a small amount of water, if necessary. If you take any of the following medications: METFORMIN, GLUCOPHAGE, GLUCOVANCE, AVANDAMET, RIOMET, FORTAMET, ACTOPLUS MET, JANUMET, GLUMETZA or METAGLIP, you MAY be asked to HOLD this medication 48 hours AFTER the exam.   The purpose of you drinking the oral contrast is to aid in the visualization of your intestinal tract. The contrast solution may cause some diarrhea. Depending on your individual set of symptoms, you may also receive an intravenous injection of x-ray contrast/dye. Plan on being at [facility] for 45 minutes or longer, depending on the type of exam you are having performed.   If you have any questions regarding your exam or if you need to reschedule, you may call [facility] at [phone] between the hours of 8:00 am and 5:00 pm, Monday-Friday.    You have been scheduled for an endoscopy. Please follow written instructions given to you at your visit today.  If you use inhalers (even only as needed), please bring them with you on the day of your procedure.  If you take any of the following medications, they will need to be adjusted prior to your procedure:   DO NOT TAKE 7 DAYS PRIOR TO TEST- Trulicity (dulaglutide) Ozempic, Wegovy (semaglutide) Mounjaro (tirzepatide) Bydureon Bcise (exanatide extended release)  DO NOT TAKE 1 DAY PRIOR TO YOUR  TEST Rybelsus (semaglutide) Adlyxin (lixisenatide) Victoza (liraglutide) Byetta (exanatide) ___________________________________________________________________________  Due to recent changes in healthcare laws, you may see the results of your imaging and laboratory studies on MyChart before your provider has had a chance to review them.  We understand that in some cases there may be results that are confusing or concerning to you. Not all laboratory results come back in the same time frame and the provider may be waiting for multiple results in order to interpret others.  Please give us  48 hours in order for your provider to thoroughly review all the results before contacting the office for clarification of your results.  _______________________________________________________  If your blood pressure at your visit was 140/90 or greater, please contact your primary care physician to follow up on this.  _______________________________________________________  If you are age 1 or older, your body mass index should be between 23-30. Your Body mass index is 23.28 kg/m. If this is out of the aforementioned range listed, please consider follow up with your Primary Care Provider.  If you are age 19 or younger, your body mass index should be between 19-25. Your Body mass index is 23.28 kg/m. If this is out of the aformentioned range listed, please consider follow up with your Primary Care Provider.   ________________________________________________________  The [facility] providers would like to encourage you to use MYCHART to communicate with providers for non-urgent requests or questions.  Due to long hold times on the telephone, sending your provider a message by [facility] may be a faster and more efficient way to  get a response.  Please allow 48 business hours for a response.  Please remember that this is for non-urgent requests.  _______________________________________________________  [facility] is using a team-based approach to care.  Your team is made up of your doctor and two to three APPS. Our APPS (Nurse Practitioners and Physician Assistants) work with your physician to ensure care continuity for you. They are fully qualified to address your health concerns and develop a treatment plan. They communicate directly with your gastroenterologist to care for you. Seeing the Advanced Practice Practitioners on your physician's team can help you by facilitating care more promptly, often allowing for earlier appointments, access to diagnostic testing, procedures, and other specialty referrals.   Thank you for trusting me with your gastrointestinal care. Deanna May, FNP-C

## 2023-11-14 ENCOUNTER — Ambulatory Visit (HOSPITAL_COMMUNITY): Payer: MEDICAID

## 2023-11-18 ENCOUNTER — Ambulatory Visit (INDEPENDENT_AMBULATORY_CARE_PROVIDER_SITE_OTHER): Payer: MEDICAID | Admitting: Clinical

## 2023-11-18 ENCOUNTER — Ambulatory Visit: Payer: Self-pay | Admitting: Gastroenterology

## 2023-11-18 ENCOUNTER — Ambulatory Visit (HOSPITAL_COMMUNITY)
Admission: RE | Admit: 2023-11-18 | Discharge: 2023-11-18 | Disposition: A | Discharge status: Home / Self Care | Payer: MEDICAID | Source: Ambulatory Visit | Attending: Gastroenterology | Admitting: Gastroenterology

## 2023-11-18 DIAGNOSIS — G8929 Other chronic pain: Secondary | ICD-10-CM | POA: Insufficient documentation

## 2023-11-18 DIAGNOSIS — R112 Nausea with vomiting, unspecified: Secondary | ICD-10-CM | POA: Insufficient documentation

## 2023-11-18 DIAGNOSIS — F411 Generalized anxiety disorder: Secondary | ICD-10-CM

## 2023-11-18 DIAGNOSIS — R634 Abnormal weight loss: Secondary | ICD-10-CM | POA: Diagnosis present

## 2023-11-18 DIAGNOSIS — R1013 Epigastric pain: Secondary | ICD-10-CM | POA: Diagnosis present

## 2023-11-18 NOTE — Progress Notes (Signed)
 Time: 9:00 am-9:59 am Diagnosis: F41.1 CPT Code: 90837P  Clinten was seen in person for individual therapy. He reported an increase in depressive symptoms over the past week, which he attributed to not having had a therapy session. He believes therapy has been helping him. Session focused on processing his image of himself and his concept of autism spectrum disorder, as well as what it may mean for his future, and in light of recent news. Therapist offered psychoeducation on developments in autism diagnosis. He has experienced suicidal ideation without a plan over the past week, and agreed to reach out and to go to the hospital if this worsens. He is scheduled to be seen again in two weeks, and therapist asked the administrative team to reach out and offer him an appointment that opened in one week.   Therapy Agreement Twice weekly therapy appointments  Continue with political interest on a regular basis  I agree to go to the emergency room or call 911 if thoughts of self harm worsen Continue recreational reading on a regular basis Go for regular walks If symptoms do not improve in 8 weeks as evidenced by a decreased BDI score, we will pursue more intensive treatment     Treatment Plan Client Abilities/Strengths Kaydenn attends appointments regularly and is able to articulate thoughts and experiences clearly. Client Treatment Preferences  Nelvin prefers in-person appointments and will need appointments that fit with his school schedule.  Client Statement of Needs  Reginaldo reported that he needs help to manage suicidal ideation, and symptoms of depression and anxiety. Treatment Level  Weekly with reduced frequency as progress is made. Symptoms  Vomiting, suicidal ideation, feelings of sadness, loss of pleasure, lack of motivation, poor self image, inconsistent appetite (sometimes so sad he does not eat), fluctuations in sleep, stomach pain, sporadic leg pain, headaches several times weekly  (takes prescription medication for this)  Problems Addressed  Suicidal ideation, symptoms of depression and anxiety Goals 1. Chirag would like to improve his overall mood   Objective Reduce frequency and intensity of suicidal ideation Target Date: 08/27/2024 Frequency: Weekly  Progress: 0 Modality: individual  Related Interventions Therapist will help Chad to identify and disengage from negative thought patterns using CBT-based strategies Therapist will provide Akshar with opportunities to process her experiences in session Therapist will provide Tesla with additional resources as appropriate Therapist will provide Skip with strategies to help her manage her emotions, such as deep breathing, meditation, mindfulness, and self care Therapist will engage Tyrin in behavior activation, including pleasant events scheduling, incorporation of structure into his routine, and mastery experiences               [name], PhD                          [name], PhD               [name], PhD

## 2023-11-25 ENCOUNTER — Ambulatory Visit: Payer: MEDICAID | Admitting: Clinical

## 2023-11-25 DIAGNOSIS — F411 Generalized anxiety disorder: Secondary | ICD-10-CM | POA: Diagnosis not present

## 2023-11-25 NOTE — Progress Notes (Signed)
 Time: 1:00 pm-1:59 pm Diagnosis: F41.1 CPT Code: 09162E  Dayven was seen in person for individual therapy. He reported continued suicidal ideation without plan or intent. Session focused on his ongoing processing of his autism diagnosis in terms of his hopes and plans for the future. Therapist offered psychoeducation on autism and engaged him in discussion of other factors that have influenced his experience (for example, challenges in his childhood). He is scheduled to be seen again in one week.  Therapy Agreement Twice weekly therapy appointments  Continue with political interest on a regular basis  I agree to go to the emergency room or call 911 if thoughts of self harm worsen Continue recreational reading on a regular basis Go for regular walks If symptoms do not improve in 8 weeks as evidenced by a decreased BDI score, we will pursue more intensive treatment     Treatment Plan Client Abilities/Strengths Shawnte attends appointments regularly and is able to articulate thoughts and experiences clearly. Client Treatment Preferences  Jermichael prefers in-person appointments and will need appointments that fit with his school schedule.  Client Statement of Needs  Adhrit reported that he needs help to manage suicidal ideation, and symptoms of depression and anxiety. Treatment Level  Weekly with reduced frequency as progress is made. Symptoms  Vomiting, suicidal ideation, feelings of sadness, loss of pleasure, lack of motivation, poor self image, inconsistent appetite (sometimes so sad he does not eat), fluctuations in sleep, stomach pain, sporadic leg pain, headaches several times weekly (takes prescription medication for this)  Problems Addressed  Suicidal ideation, symptoms of depression and anxiety Goals 1. Kanton would like to improve his overall mood   Objective Reduce frequency and intensity of suicidal ideation Target Date: 08/27/2024 Frequency: Weekly  Progress: 0 Modality:  individual  Related Interventions Therapist will help Neko to identify and disengage from negative thought patterns using CBT-based strategies Therapist will provide Gerry with opportunities to process her experiences in session Therapist will provide Ernst with additional resources as appropriate Therapist will provide Tanveer with strategies to help her manage her emotions, such as deep breathing, meditation, mindfulness, and self care Therapist will engage Izac in behavior activation, including pleasant events scheduling, incorporation of structure into his routine, and mastery experiences     [name], PhD               [name], PhD

## 2023-12-02 ENCOUNTER — Ambulatory Visit: Payer: MEDICAID | Admitting: Clinical

## 2023-12-02 DIAGNOSIS — F411 Generalized anxiety disorder: Secondary | ICD-10-CM

## 2023-12-02 NOTE — Progress Notes (Signed)
 Time: 1:00 pm-1:59 pm Diagnosis: F41.1 CPT Code: 09162E  Brailon was seen in person for individual therapy. He reported continued suicidal ideation without plan or intent, and shared that a disturbing clip shown in a class (during which someone jumped out a window) had upset him. He reported that he does not have active plans and will follow his safety plan. Moise reflected upon his plans for the future, and therapist suggested the ACT strategy of listing and ranking his top 10 values. He is scheduled to be seen again in one week.  Therapy Agreement Twice weekly therapy appointments  Continue with political interest on a regular basis  I agree to go to the emergency room or call 911 if thoughts of self harm worsen Continue recreational reading on a regular basis Go for regular walks If symptoms do not improve in 8 weeks as evidenced by a decreased BDI score, we will pursue more intensive treatment     Treatment Plan Client Abilities/Strengths Ritchard attends appointments regularly and is able to articulate thoughts and experiences clearly. Client Treatment Preferences  Stefano prefers in-person appointments and will need appointments that fit with his school schedule.  Client Statement of Needs  Nyshawn reported that he needs help to manage suicidal ideation, and symptoms of depression and anxiety. Treatment Level  Weekly with reduced frequency as progress is made. Symptoms  Vomiting, suicidal ideation, feelings of sadness, loss of pleasure, lack of motivation, poor self image, inconsistent appetite (sometimes so sad he does not eat), fluctuations in sleep, stomach pain, sporadic leg pain, headaches several times weekly (takes prescription medication for this)  Problems Addressed  Suicidal ideation, symptoms of depression and anxiety Goals 1. Barrett would like to improve his overall mood   Objective Reduce frequency and intensity of suicidal ideation Target Date: 08/27/2024 Frequency:  Weekly  Progress: 0 Modality: individual  Related Interventions Therapist will help Jamaury to identify and disengage from negative thought patterns using CBT-based strategies Therapist will provide Delphin with opportunities to process her experiences in session Therapist will provide Traveon with additional resources as appropriate Therapist will provide Dinesh with strategies to help her manage her emotions, such as deep breathing, meditation, mindfulness, and self care Therapist will engage Paydon in behavior activation, including pleasant events scheduling, incorporation of structure into his routine, and mastery experiences      [name], PhD               [name], PhD

## 2023-12-05 ENCOUNTER — Ambulatory Visit: Payer: MEDICAID | Admitting: Clinical

## 2023-12-05 DIAGNOSIS — F411 Generalized anxiety disorder: Secondary | ICD-10-CM | POA: Diagnosis not present

## 2023-12-05 NOTE — Progress Notes (Signed)
 Time: 1:00 pm-1:59 pm Diagnosis: F41.1 CPT Code: 09162E  [name] was seen in person for individual therapy. He reached out to the therapist on Wednsday, 10/15, to communicate that his mood had taken a turn for the worse. Therapist reached out via phone and secure message and offered him an appointment 10/16 or 10/17. He shared that he had become overwhelmed with emotion after being paired with a former romantic interest in his negotiations class. Session focused on processing this experience. Therapist cautioned against overly attributing his confusion to his being autistic, instead normalizing that romantic relationships can be confusing. He again emphasized that he is safe and reported feeling better since Wednesday. He is scheduled to be seen again on Tuesday.  Therapy Agreement Twice weekly therapy appointments  Continue with political interest on a regular basis  I agree to go to the emergency room or call 911 if thoughts of self harm worsen Continue recreational reading on a regular basis Go for regular walks If symptoms do not improve in 8 weeks as evidenced by a decreased BDI score, we will pursue more intensive treatment     Treatment Plan Client Abilities/Strengths Cory Lester attends appointments regularly and is able to articulate thoughts and experiences clearly. Client Treatment Preferences  Cory Lester prefers in-person appointments and will need appointments that fit with his school schedule.  Client Statement of Needs  Cory Lester reported that he needs help to manage suicidal ideation, and symptoms of depression and anxiety. Treatment Level  Weekly with reduced frequency as progress is made. Symptoms  Vomiting, suicidal ideation, feelings of sadness, loss of pleasure, lack of motivation, poor self image, inconsistent appetite (sometimes so sad he does not eat), fluctuations in sleep, stomach pain, sporadic leg pain, headaches several times weekly (takes prescription medication for this)   Problems Addressed  Suicidal ideation, symptoms of depression and anxiety Goals 1. Cory Lester would like to improve his overall mood   Objective Reduce frequency and intensity of suicidal ideation Target Date: 08/27/2024 Frequency: Weekly  Progress: 0 Modality: individual  Related Interventions Therapist will help Cory Lester to identify and disengage from negative thought patterns using CBT-based strategies Therapist will provide Cory Lester with opportunities to process her experiences in session Therapist will provide Cory Lester with additional resources as appropriate Therapist will provide Cory Lester with strategies to help her manage her emotions, such as deep breathing, meditation, mindfulness, and self care Therapist will engage Cory Lester in behavior activation, including pleasant events scheduling, incorporation of structure into his routine, and mastery experiences           Cory LITTIE Ponto, PhD               Cory LITTIE Ponto, PhD

## 2023-12-07 NOTE — Progress Notes (Unsigned)
 [facility] History and Physical   Primary Care Physician:  Wendee Lynwood HERO, NP   Reason for Procedure:  Nausea, vomiting, epigastric abdominal pain  Plan:    Upper endoscopy     HPI: Cory Lester is a 23 y.o. male undergoing upper endoscopy for investigation of nausea, vomiting and epigastric pain.  Patient reports a history of chronic epigastric pain for 5 years.  Previous EGD in 2019 was normal endoscopically and histologically.  More recently has developed problems with nausea and vomiting that seem to be cyclic in nature.  Unclear if it may be stress related.  H. pylori breath test 07/2023 negative.  No celiac testing.  Per quadrant ultrasound normal.   Past Medical History:  Diagnosis Date   Autism spectrum disorder    Depression    Migraines    Seizure [facility])     Past Surgical History:  Procedure Laterality Date   CIRCUMCISION     OTHER SURGICAL HISTORY     Cyst removed from right side of face   TYMPANOSTOMY TUBE PLACEMENT      Prior to Admission medications   Medication Sig Start Date End Date Taking? Authorizing Provider  omeprazole  (PRILOSEC) 20 MG capsule Take 1 capsule (20 mg total) by mouth daily. 11/11/23   May, Deanna J, NP  ondansetron  (ZOFRAN -ODT) 4 MG disintegrating tablet Take 1 tablet (4 mg total) by mouth every 12 (twelve) hours as needed for nausea or vomiting. 11/11/23   May, Deanna J, NP    Current Outpatient Medications  Medication Sig Dispense Refill   omeprazole  (PRILOSEC) 20 MG capsule Take 1 capsule (20 mg total) by mouth daily. 30 capsule 3   ondansetron  (ZOFRAN -ODT) 4 MG disintegrating tablet Take 1 tablet (4 mg total) by mouth every 12 (twelve) hours as needed for nausea or vomiting. 20 tablet 0   No current facility-administered medications for this visit.    Allergies as of 12/09/2023   (No Known Allergies)    Family History  Problem Relation Age of Onset   Depression Mother    Anxiety disorder Mother    Kidney Stones  Mother    Mental illness Mother    Learning disabilities Father    Depression Father    Mental illness Father    ADD / ADHD Sister    Depression Brother    Mental illness Brother    Crohn's disease Maternal Aunt    Hypertension Maternal Grandfather    Depression Paternal Grandmother    Alcohol abuse Paternal Grandfather    Depression Paternal Grandfather     Social History   Socioeconomic History   Marital status: Single    Spouse name: Not on file   Number of children: 0   Years of education: Not on file   Highest education level: Bachelor's degree (e.g., BA, AB, BS)  Occupational History   Not on file  Tobacco Use   Smoking status: Never   Smokeless tobacco: Never  Vaping Use   Vaping status: Never Used  Substance and Sexual Activity   Alcohol use: No   Drug use: No   Sexual activity: Never  Other Topics Concern   Not on file  Social History Narrative   [employer] degree. Law school in fall. 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   Social Network    Social Network: Not on file  Intimate Partner Violence: Unknown (11/04/2022)   Received from [facility]   HITS    Physically Hurt: Not on file    Insult or Talk Down To: Not on file    Threaten Physical Harm: Not on file    Scream or Curse: Not on file    Review of Systems:  All other review of systems negative except as mentioned in the HPI.  Physical Exam: Vital signs There were no vitals taken for this visit.  General:   Alert,  Well-developed, well-nourished, pleasant and cooperative in NAD Airway:  Mallampati  Lungs:  Clear throughout to auscultation.   Heart:  Regular rate and rhythm; no murmurs, clicks,  rubs,  or gallops. Abdomen:  Soft, nontender and nondistended. Normal bowel sounds.   Neuro/Psych:  Normal mood and affect. A and O x 3  Inocente Hausen, MD [facility]

## 2023-12-09 ENCOUNTER — Ambulatory Visit: Payer: MEDICAID | Admitting: Pediatrics

## 2023-12-09 ENCOUNTER — Encounter: Payer: Self-pay | Admitting: Pediatrics

## 2023-12-09 ENCOUNTER — Ambulatory Visit (INDEPENDENT_AMBULATORY_CARE_PROVIDER_SITE_OTHER): Payer: MEDICAID | Admitting: Clinical

## 2023-12-09 VITALS — BP 121/73 | HR 68 | Temp 99.0°F | Resp 10 | Ht 66.0 in | Wt 144.0 lb

## 2023-12-09 DIAGNOSIS — K229 Disease of esophagus, unspecified: Secondary | ICD-10-CM | POA: Diagnosis not present

## 2023-12-09 DIAGNOSIS — F411 Generalized anxiety disorder: Secondary | ICD-10-CM

## 2023-12-09 DIAGNOSIS — R112 Nausea with vomiting, unspecified: Secondary | ICD-10-CM | POA: Diagnosis not present

## 2023-12-09 DIAGNOSIS — R1013 Epigastric pain: Secondary | ICD-10-CM

## 2023-12-09 MED ORDER — SODIUM CHLORIDE 0.9 % IV SOLN: 500.0000 mL | INTRAVENOUS | Status: DC

## 2023-12-09 NOTE — Patient Instructions (Signed)
 Resume previous diet.  Continue present medications.  Await pathology results.  Follow up in the office in 6-8 weeks (see appointment details).   YOU HAD AN ENDOSCOPIC PROCEDURE TODAY AT THE  ENDOSCOPY CENTER:   Refer to the procedure report that was given to you for any specific questions about what was found during the examination.  If the procedure report does not answer your questions, please call your gastroenterologist to clarify.  If you requested that your care partner not be given the details of your procedure findings, then the procedure report has been included in a sealed envelope for you to review at your convenience later.  YOU SHOULD EXPECT: Some feelings of bloating in the abdomen. Passage of more gas than usual.  Walking can help get rid of the air that was put into your GI tract during the procedure and reduce the bloating. If you had a lower endoscopy (such as a colonoscopy or flexible sigmoidoscopy) you may notice spotting of blood in your stool or on the toilet paper. If you underwent a bowel prep for your procedure, you may not have a normal bowel movement for a few days.  Please Note:  You might notice some irritation and congestion in your nose or some drainage.  This is from the oxygen used during your procedure.  There is no need for concern and it should clear up in a day or so.  SYMPTOMS TO REPORT IMMEDIATELY:  Following upper endoscopy (EGD)  Vomiting of blood or coffee ground material  New chest pain or pain under the shoulder blades  Painful or persistently difficult swallowing  New shortness of breath  Fever of 100F or higher  Black, tarry-looking stools  For urgent or emergent issues, a gastroenterologist can be reached at any hour by calling [phone]. Do not use MyChart messaging for urgent concerns.    DIET:  We do recommend a small meal at first, but then you may proceed to your regular diet.  Drink plenty of fluids but you should avoid  alcoholic beverages for 24 hours.  ACTIVITY:  You should plan to take it easy for the rest of today and you should NOT DRIVE or use heavy machinery until tomorrow (because of the sedation medicines used during the test).    FOLLOW UP: Our staff will call the number listed on your records the next business day following your procedure.  We will call around 7:15- 8:00 am to check on you and address any questions or concerns that you may have regarding the information given to you following your procedure. If we do not reach you, we will leave a message.     If any biopsies were taken you will be contacted by phone or by letter within the next 1-3 weeks.  Please call us  at [phone] if you have not heard about the biopsies in 3 weeks.    SIGNATURES/CONFIDENTIALITY: You and/or your care partner have signed paperwork which will be entered into your electronic medical record.  These signatures attest to the fact that that the information above on your After Visit Summary has been reviewed and is understood.  Full responsibility of the confidentiality of this discharge information lies with you and/or your care-partner.

## 2023-12-09 NOTE — Progress Notes (Signed)
 Pt's states no medical or surgical changes since previsit or office visit.

## 2023-12-09 NOTE — Progress Notes (Signed)
 Vss nad trans to pacu

## 2023-12-09 NOTE — Progress Notes (Signed)
 Time: 9:00 am-9:57 am Diagnosis: F41.1 CPT Code: 90837P  Cory Lester was seen in person for individual therapy. He has continued to reflect upon how to attract a romantic partner, and his plans for the future. Therapist engaged him in discussion, offering reframes for several negative cognitions and pointing out black and white thinking. Suicidal ideation without plan was endorsed, and Cory Lester indicated that he can follow the plan for safety. He is scheduled to be seen again in one week.  Therapy Agreement Twice weekly therapy appointments  Continue with political interest on a regular basis  I agree to go to the emergency room or call 911 if thoughts of self harm worsen Continue recreational reading on a regular basis Go for regular walks If symptoms do not improve in 8 weeks as evidenced by a decreased BDI score, we will pursue more intensive treatment     Treatment Plan Client Abilities/Strengths Cory Lester attends appointments regularly and is able to articulate thoughts and experiences clearly. Client Treatment Preferences  Cory Lester prefers in-person appointments and will need appointments that fit with his school schedule.  Client Statement of Needs  Cory Lester reported that he needs help to manage suicidal ideation, and symptoms of depression and anxiety. Treatment Level  Weekly with reduced frequency as progress is made. Symptoms  Vomiting, suicidal ideation, feelings of sadness, loss of pleasure, lack of motivation, poor self image, inconsistent appetite (sometimes so sad he does not eat), fluctuations in sleep, stomach pain, sporadic leg pain, headaches several times weekly (takes prescription medication for this)  Problems Addressed  Suicidal ideation, symptoms of depression and anxiety Goals 1. Cory Lester would like to improve his overall mood   Objective Reduce frequency and intensity of suicidal ideation Target Date: 08/27/2024 Frequency: Weekly  Progress: 0 Modality: individual  Related  Interventions Therapist will help Cory Lester to identify and disengage from negative thought patterns using CBT-based strategies Therapist will provide Cory Lester with opportunities to process her experiences in session Therapist will provide Cory Lester with additional resources as appropriate Therapist will provide Cory Lester with strategies to help her manage her emotions, such as deep breathing, meditation, mindfulness, and self care Therapist will engage Dorance in behavior activation, including pleasant events scheduling, incorporation of structure into his routine, and mastery experiences      Cory LITTIE Ponto, PhD

## 2023-12-09 NOTE — Op Note (Signed)
 [facility] Patient Name: Cory Lester Procedure Date: 12/09/2023 3:21 PM MRN: [id number] Endoscopist: Inocente Hausen , MD, [id number] Age: 23 Referring MD:  Date of Birth: 25-Jun-2000 Gender: Male Account #: [id number] Procedure:                Upper GI endoscopy Indications:              Epigastric abdominal pain, Nausea with vomiting Medicines:                Monitored Anesthesia Care Procedure:                Pre-Anesthesia Assessment:                           - Prior to the procedure, a History and Physical                            was performed, and patient medications and                            allergies were reviewed. The patient's tolerance of                            previous anesthesia was also reviewed. The risks                            and benefits of the procedure and the sedation                            options and risks were discussed with the patient.                            All questions were answered, and informed consent                            was obtained. Prior Anticoagulants: The patient has                            taken no anticoagulant or antiplatelet agents. ASA                            Grade Assessment: II - A patient with mild systemic                            disease. After reviewing the risks and benefits,                            the patient was deemed in satisfactory condition to                            undergo the procedure.                           After obtaining informed consent, the endoscope was  passed under direct vision. Throughout the                            procedure, the patient's blood pressure, pulse, and                            oxygen saturations were monitored continuously. The                            GIF [id number] #[id number] was introduced through the                            mouth, and advanced to the second part of duodenum.                            The  upper GI endoscopy was accomplished without                            difficulty. The patient tolerated the procedure                            well. Scope In: Scope Out: Findings:                 The examined esophagus was normal. Biopsies were                            obtained from the proximal and distal esophagus                            with cold forceps for histology for evaluation of                            eosinophilic esophagitis.                           The gastric body, gastric antrum, cardia (on                            retroflexion) and gastric fundus (on retroflexion)                            were normal. Biopsies were taken with a cold                            forceps for Helicobacter pylori testing.                           The duodenal bulb and second portion of the                            duodenum were normal. Biopsies for histology were                            taken with a cold forceps for evaluation  of celiac                            disease. Complications:            No immediate complications. Estimated blood loss:                            Minimal. Estimated Blood Loss:     Estimated blood loss was minimal. Impression:               - Normal esophagus.                           - Normal gastric body, antrum, cardia and gastric                            fundus. Biopsied.                           - Normal duodenal bulb and second portion of the                            duodenum. Biopsied.                           - Biopsies were taken with a cold forceps for                            evaluation of eosinophilic esophagitis. Recommendation:           - Discharge patient to home (ambulatory).                           - Await pathology results.                           - RTC 6-8 weeks with Dr. Suzann or APP Oscar Coombs or [facility] May)                           - The findings and recommendations were  discussed                            with the patient's family.                           - Patient has a contact number available for                            emergencies. The signs and symptoms of potential                            delayed complications were discussed with the  patient. Return to normal activities tomorrow.                            Written discharge instructions were provided to the                            patient. Inocente Hausen, MD 12/09/2023 3:59:18 PM This report has been signed electronically.

## 2023-12-10 ENCOUNTER — Telehealth: Payer: Self-pay | Admitting: Lactation Services

## 2023-12-10 NOTE — Telephone Encounter (Signed)
 No answer -no voice mail set up.

## 2023-12-15 ENCOUNTER — Ambulatory Visit: Payer: Self-pay | Admitting: Pediatrics

## 2023-12-15 LAB — SURGICAL PATHOLOGY

## 2023-12-16 ENCOUNTER — Ambulatory Visit: Payer: MEDICAID | Admitting: Clinical

## 2023-12-16 DIAGNOSIS — F411 Generalized anxiety disorder: Secondary | ICD-10-CM

## 2023-12-16 NOTE — Progress Notes (Signed)
 Time: 9:00 am-9:57 am Diagnosis: F41.1 CPT Code: 90837P  Cory Lester was seen in person for individual therapy. He reported that he had gotten back normal results on his endoscopy, which had exacerbated feelings of hopelessness. Therapist pointed out that even if his symptoms are psychosomatic, they are still real and treatable.Suicidal ideation without plan or intent was endorsed, and Daegan continues to list religious beliefs as a main barrier. He was open to the idea of more intensive therapy and agreed to call behavioral health hospital for homework, with the plan that he will call with the therapist in the next session if he does not. He agreed that he is able to follow his safety plan. He is scheduled to be seen again in one week.  Therapy Agreement Twice weekly therapy appointments  Continue with political interest on a regular basis  I agree to go to the emergency room or call 911 if thoughts of self harm worsen Continue recreational reading on a regular basis Go for regular walks If symptoms do not improve in 8 weeks as evidenced by a decreased BDI score, we will pursue more intensive treatment     Treatment Plan Client Abilities/Strengths Rakwon attends appointments regularly and is able to articulate thoughts and experiences clearly. Client Treatment Preferences  Abran prefers in-person appointments and will need appointments that fit with his school schedule.  Client Statement of Needs  Taequan reported that he needs help to manage suicidal ideation, and symptoms of depression and anxiety. Treatment Level  Weekly with reduced frequency as progress is made. Symptoms  Vomiting, suicidal ideation, feelings of sadness, loss of pleasure, lack of motivation, poor self image, inconsistent appetite (sometimes so sad he does not eat), fluctuations in sleep, stomach pain, sporadic leg pain, headaches several times weekly (takes prescription medication for this)  Problems Addressed  Suicidal  ideation, symptoms of depression and anxiety Goals 1. Sherlock would like to improve his overall mood   Objective Reduce frequency and intensity of suicidal ideation Target Date: 08/27/2024 Frequency: Weekly  Progress: 0 Modality: individual  Related Interventions Therapist will help Shogo to identify and disengage from negative thought patterns using CBT-based strategies Therapist will provide Yoel with opportunities to process her experiences in session Therapist will provide Kael with additional resources as appropriate Therapist will provide Sebron with strategies to help her manage her emotions, such as deep breathing, meditation, mindfulness, and self care Therapist will engage Buck in behavior activation, including pleasant events scheduling, incorporation of structure into his routine, and mastery experiences      [name], PhD               [name], PhD

## 2023-12-23 ENCOUNTER — Ambulatory Visit (INDEPENDENT_AMBULATORY_CARE_PROVIDER_SITE_OTHER): Payer: MEDICAID | Admitting: Clinical

## 2023-12-23 DIAGNOSIS — F411 Generalized anxiety disorder: Secondary | ICD-10-CM

## 2023-12-23 NOTE — Progress Notes (Signed)
 Time: 9:00 am-9:57 am Diagnosis: F41.1 CPT Code: 90837P  Cory Lester was seen in person for individual therapy. He reported that he was in a better mood than the previous week. He had further researched PHP and IOP, but reported that he did not feel it was necessary at this time. He attributed his improvement in mood in part to election day. Session focused on considering how to incorporate more of Cory Lester's interests and passions into his day-to-day, as well as how to create social connection incorporating his interests. He is scheduled to be seen again in one week.  Therapy Agreement Twice weekly therapy appointments  Continue with political interest on a regular basis  I agree to go to the emergency room or call 911 if thoughts of self harm worsen Continue recreational reading on a regular basis Go for regular walks If symptoms do not improve in 8 weeks as evidenced by a decreased BDI score, we will pursue more intensive treatment     Treatment Plan Client Abilities/Strengths Cory Lester attends appointments regularly and is able to articulate thoughts and experiences clearly. Client Treatment Preferences  Cory Lester prefers in-person appointments and will need appointments that fit with his school schedule.  Client Statement of Needs  Cory Lester reported that he needs help to manage suicidal ideation, and symptoms of depression and anxiety. Treatment Level  Weekly with reduced frequency as progress is made. Symptoms  Vomiting, suicidal ideation, feelings of sadness, loss of pleasure, lack of motivation, poor self image, inconsistent appetite (sometimes so sad he does not eat), fluctuations in sleep, stomach pain, sporadic leg pain, headaches several times weekly (takes prescription medication for this)  Problems Addressed  Suicidal ideation, symptoms of depression and anxiety Goals 1. Cory Lester would like to improve his overall mood   Objective Reduce frequency and intensity of suicidal  ideation Target Date: 08/27/2024 Frequency: Weekly  Progress: 0 Modality: individual  Related Interventions Cory Lester will help Cory Lester to identify and disengage from negative thought patterns using CBT-based strategies Cory Lester will provide Cory Lester with opportunities to process her experiences in session Cory Lester will provide Cory Lester with additional resources as appropriate Cory Lester will provide Cory Lester with strategies to help her manage her emotions, such as deep breathing, meditation, mindfulness, and self care Cory Lester will engage Cory Lester in behavior activation, including pleasant events scheduling, incorporation of structure into his routine, and mastery experiences  Cory LITTIE Ponto, PhD

## 2023-12-30 ENCOUNTER — Ambulatory Visit (INDEPENDENT_AMBULATORY_CARE_PROVIDER_SITE_OTHER): Payer: MEDICAID | Admitting: Clinical

## 2023-12-30 DIAGNOSIS — F411 Generalized anxiety disorder: Secondary | ICD-10-CM

## 2023-12-30 NOTE — Progress Notes (Signed)
 Time: 9:00 am-9:57 am Diagnosis: F41.1 CPT Code: 90837P  Cory Lester was seen in person for individual therapy. He reported an overall stable mood, but continued frustrations with school and his dating life. Session focused on exploring his beliefs, and considering how he might meet like-minded others. Therapist also suggested grounding strategies to help him redirect from negative thought patterns (e.g., a hot shower, food he enjoys, a walk, etc.) He is scheduled to be seen again in one week.  Therapy Agreement Twice weekly therapy appointments  Continue with political interest on a regular basis  I agree to go to the emergency room or call 911 if thoughts of self harm worsen Continue recreational reading on a regular basis Go for regular walks If symptoms do not improve in 8 weeks as evidenced by a decreased BDI score, we will pursue more intensive treatment     Treatment Plan Client Abilities/Strengths Cory Lester attends appointments regularly and is able to articulate thoughts and experiences clearly. Client Treatment Preferences  Cory Lester prefers in-person appointments and will need appointments that fit with his school schedule.  Client Statement of Needs  Cory Lester reported that he needs help to manage suicidal ideation, and symptoms of depression and anxiety. Treatment Level  Weekly with reduced frequency as progress is made. Symptoms  Vomiting, suicidal ideation, feelings of sadness, loss of pleasure, lack of motivation, poor self image, inconsistent appetite (sometimes so sad he does not eat), fluctuations in sleep, stomach pain, sporadic leg pain, headaches several times weekly (takes prescription medication for this)  Problems Addressed  Suicidal ideation, symptoms of depression and anxiety Goals 1. Cory Lester would like to improve his overall mood   Objective Reduce frequency and intensity of suicidal ideation Target Date: 08/27/2024 Frequency: Weekly  Progress: 0 Modality: individual   Related Interventions Therapist will help Cory Lester to identify and disengage from negative thought patterns using CBT-based strategies Therapist will provide Cory Lester with opportunities to process her experiences in session Therapist will provide Cory Lester with additional resources as appropriate Therapist will provide Cory Lester with strategies to help her manage her emotions, such as deep breathing, meditation, mindfulness, and self care Therapist will engage Cory Lester in behavior activation, including pleasant events scheduling, incorporation of structure into his routine, and mastery experiences  Cory LITTIE Ponto, PhD               Cory LITTIE Ponto, PhD

## 2024-01-06 ENCOUNTER — Ambulatory Visit (INDEPENDENT_AMBULATORY_CARE_PROVIDER_SITE_OTHER): Payer: MEDICAID | Admitting: Clinical

## 2024-01-06 DIAGNOSIS — F411 Generalized anxiety disorder: Secondary | ICD-10-CM | POA: Diagnosis not present

## 2024-01-06 NOTE — Progress Notes (Signed)
 Time: 9:00 am-9:57 am Diagnosis: F41.1 CPT Code: 90837P  Cory Lester was seen in person for individual therapy. He arrived questioning whether he would benefit from challenging his thoughts, as he believes they are a result of an unfulfilling situation. Therapist offered psychoeducation on CBT, agreeing that it makes sense to look for ways big and small to shift his situation, and also suggested looking for ways to disengage from unproductive negative thinking. Therapist suggested trying a leaves on a stream meditation, and he agreed to try this. He is scheduled to be seen again in two weeks. He indicated that he would be safe and would following his coping plan to go to the emergency room if this changes.  Therapy Agreement Twice weekly therapy appointments  Continue with political interest on a regular basis  I agree to go to the emergency room or call 911 if thoughts of self harm worsen Continue recreational reading on a regular basis Go for regular walks If symptoms do not improve in 8 weeks as evidenced by a decreased BDI score, we will pursue more intensive treatment     Treatment Plan Client Abilities/Strengths Cory Lester attends appointments regularly and is able to articulate thoughts and experiences clearly. Client Treatment Preferences  Cory Lester prefers in-person appointments and will need appointments that fit with his school schedule.  Client Statement of Needs  Cory Lester reported that he needs help to manage suicidal ideation, and symptoms of depression and anxiety. Treatment Level  Weekly with reduced frequency as progress is made. Symptoms  Vomiting, suicidal ideation, feelings of sadness, loss of pleasure, lack of motivation, poor self image, inconsistent appetite (sometimes so sad he does not eat), fluctuations in sleep, stomach pain, sporadic leg pain, headaches several times weekly (takes prescription medication for this)  Problems Addressed  Suicidal ideation, symptoms of depression  and anxiety Goals 1. Cory Lester would like to improve his overall mood   Objective Reduce frequency and intensity of suicidal ideation Target Date: 08/27/2024 Frequency: Weekly  Progress: 0 Modality: individual  Related Interventions Therapist will help Cory Lester to identify and disengage from negative thought patterns using CBT-based strategies Therapist will provide Cory Lester with opportunities to process her experiences in session Therapist will provide Cory Lester with additional resources as appropriate Therapist will provide Cory Lester with strategies to help her manage her emotions, such as deep breathing, meditation, mindfulness, and self care Therapist will engage Cory Lester in behavior activation, including pleasant events scheduling, incorporation of structure into his routine, and mastery experiences    Cory LITTIE Ponto, Cory Lester               Cory LITTIE Ponto, Cory Lester

## 2024-01-13 ENCOUNTER — Ambulatory Visit: Payer: MEDICAID | Admitting: Clinical

## 2024-01-20 ENCOUNTER — Ambulatory Visit: Payer: MEDICAID | Admitting: Clinical

## 2024-01-20 DIAGNOSIS — F411 Generalized anxiety disorder: Secondary | ICD-10-CM

## 2024-01-20 NOTE — Progress Notes (Signed)
 Time: 9:00 am-9:57 am Diagnosis: F41.1 CPT Code: 90837P  Cory Lester was seen in person for individual therapy. He reported that his Thanksgiving had gone relatively well, and he had enjoyed visiting with family. Therapist shared information she had learned about seeking more intensive treatment through Medicaid. Cory Lester indicated a plan to contact [employer] once he has completed exams. Session focused on identifying and creating a list of key topics for therapy. Cory Lester is scheduled to be seen again in one week.  List of Topics for Therapy Childhood  Dating  Career path Behavioral Activation Friends and social life   Put a pin in it topics: Autism-what does it mean for me Self Image/concept     Therapy Agreement Twice weekly therapy appointments  Continue with political interest on a regular basis  I agree to go to the emergency room or call 911 if thoughts of self harm worsen Continue recreational reading on a regular basis Go for regular walks If symptoms do not improve in 8 weeks as evidenced by a decreased BDI score, we will pursue more intensive treatment     Treatment Plan Client Abilities/Strengths [name] attends appointments regularly and is able to articulate thoughts and experiences clearly. Client Treatment Preferences  Constant prefers in-person appointments and will need appointments that fit with his school schedule.  Client Statement of Needs  [name] reported that he needs help to manage suicidal ideation, and symptoms of depression and anxiety. Treatment Level  Weekly with reduced frequency as progress is made. Symptoms  Vomiting, suicidal ideation, feelings of sadness, loss of pleasure, lack of motivation, poor self image, inconsistent appetite (sometimes so sad he does not eat), fluctuations in sleep, stomach pain, sporadic leg pain, headaches several times weekly (takes prescription medication for this)  Problems Addressed  Suicidal ideation, symptoms of depression  and anxiety Goals 1. [name] would like to improve his overall mood   Objective Reduce frequency and intensity of suicidal ideation Target Date: 08/27/2024 Frequency: Weekly  Progress: 0 Modality: individual  Related Interventions Therapist will help [name] to identify and disengage from negative thought patterns using CBT-based strategies Therapist will provide [name] with opportunities to process her experiences in session Therapist will provide [name] with additional resources as appropriate Therapist will provide [name] with strategies to help her manage her emotions, such as deep breathing, meditation, mindfulness, and self care Therapist will engage [name] in behavior activation, including pleasant events scheduling, incorporation of structure into his routine, and mastery experiences    [name], PhD

## 2024-01-27 ENCOUNTER — Ambulatory Visit: Payer: MEDICAID | Admitting: Gastroenterology

## 2024-01-27 ENCOUNTER — Ambulatory Visit: Payer: MEDICAID | Admitting: Clinical

## 2024-01-27 DIAGNOSIS — F411 Generalized anxiety disorder: Secondary | ICD-10-CM

## 2024-01-27 NOTE — Progress Notes (Signed)
 Time: 1:00 pm-1:57 pm Diagnosis: F41.1 CPT Code: 09162E  Myson was seen in person for individual therapy. 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 Therapist engaged him in discussion, encouraging to consider actions he can take, and pointing out that he may be engaging in rumination. Ark is scheduled to be seen again in one week.  List of Topics for Therapy Childhood  Dating  Career path Behavioral Activation Friends and social life   Put a pin in it topics: Autism-what does it mean for me Self Image/concept     Therapy Agreement Twice weekly therapy appointments  Continue with political interest on a regular basis  I agree to go to the emergency room or call 911 if thoughts of self harm worsen Continue recreational reading on a regular basis Go for regular walks If symptoms do not improve in 8 weeks as evidenced by a decreased BDI score, we will pursue more intensive treatment     Treatment Plan Client Abilities/Strengths Osby attends appointments regularly and is able to articulate thoughts and experiences clearly. Client Treatment Preferences  Joseth prefers in-person appointments and will need appointments that fit with his school schedule.  Client Statement of Needs  Braedyn reported that he needs help to manage suicidal ideation, and symptoms of depression and anxiety. Treatment Level  Weekly with reduced frequency as progress is made. Symptoms  Vomiting, suicidal ideation, feelings of sadness, loss of pleasure, lack of motivation, poor self image, inconsistent appetite (sometimes so sad he does not eat), fluctuations in sleep, stomach pain, sporadic leg pain, headaches several times weekly (takes prescription medication for this)  Problems Addressed  Suicidal ideation, symptoms of depression and anxiety Goals 1. Avontae would like to improve his overall mood   Objective Reduce frequency and  intensity of suicidal ideation Target Date: 08/27/2024 Frequency: Weekly  Progress: 0 Modality: individual  Related Interventions Therapist will help Jabe to identify and disengage from negative thought patterns using CBT-based strategies Therapist will provide Ashok with opportunities to process her experiences in session Therapist will provide Frederic with additional resources as appropriate Therapist will provide Ettore with strategies to help her manage her emotions, such as deep breathing, meditation, mindfulness, and self care Therapist will engage Veasna in behavior activation, including pleasant events scheduling, incorporation of structure into his routine, and mastery experiences    [name], PhD               [name], PhD

## 2024-02-03 ENCOUNTER — Ambulatory Visit: Payer: MEDICAID | Admitting: Clinical

## 2024-02-03 DIAGNOSIS — F411 Generalized anxiety disorder: Secondary | ICD-10-CM

## 2024-02-03 NOTE — Progress Notes (Signed)
 Time: 9:00 am-9:57 am Diagnosis: F41.1 CPT Code: 90837P  Mubashir was seen in person for individual therapy. He expressed openness to discussing factors in his childhood. He identified his predominant emotion in childhood as uncertainty, and described it as constant chaos. Therapist engaged him in consideration of messages he internalized at the time, including exploration of ways in which they may be inaccurate. Tarence is scheduled to be seen again in two weeks. He indicated that he will be safe during that time and can follow his safety plan.  List of Topics for Therapy Childhood  Dating  Career path Behavioral Activation Friends and social life   Put a pin in it topics: Autism-what does it mean for me Self Image/concept     Therapy Agreement Twice weekly therapy appointments  Continue with political interest on a regular basis  I agree to go to the emergency room or call 911 if thoughts of self harm worsen Continue recreational reading on a regular basis Go for regular walks If symptoms do not improve in 8 weeks as evidenced by a decreased BDI score, we will pursue more intensive treatment     Treatment Plan Client Abilities/Strengths Aldan attends appointments regularly and is able to articulate thoughts and experiences clearly. Client Treatment Preferences  Kacen prefers in-person appointments and will need appointments that fit with his school schedule.  Client Statement of Needs  Tyreik reported that he needs help to manage suicidal ideation, and symptoms of depression and anxiety. Treatment Level  Weekly with reduced frequency as progress is made. Symptoms  Vomiting, suicidal ideation, feelings of sadness, loss of pleasure, lack of motivation, poor self image, inconsistent appetite (sometimes so sad he does not eat), fluctuations in sleep, stomach pain, sporadic leg pain, headaches several times weekly (takes prescription medication for this)  Problems Addressed   Suicidal ideation, symptoms of depression and anxiety Goals 1. Haralambos would like to improve his overall mood   Objective Reduce frequency and intensity of suicidal ideation Target Date: 08/27/2024 Frequency: Weekly  Progress: 0 Modality: individual  Related Interventions Therapist will help Leigh to identify and disengage from negative thought patterns using CBT-based strategies Therapist will provide Kerby with opportunities to process her experiences in session Therapist will provide Pilot with additional resources as appropriate Therapist will provide Czar with strategies to help her manage her emotions, such as deep breathing, meditation, mindfulness, and self care Therapist will engage Philbert in behavior activation, including pleasant events scheduling, incorporation of structure into his routine, and mastery experiences      [name], PhD               [name], PhD

## 2024-02-17 ENCOUNTER — Ambulatory Visit: Payer: MEDICAID | Admitting: Clinical

## 2024-02-17 DIAGNOSIS — F411 Generalized anxiety disorder: Secondary | ICD-10-CM | POA: Diagnosis not present

## 2024-02-17 NOTE — Progress Notes (Signed)
 Time: 9:00 am-9:57 am Diagnosis: F41.1 CPT Code: 90837P  Lequan was seen in person for individual therapy. Session consisted of processing a list of recently read books he provided to the therapist, sharing that he is a voracious reader and what he reads is a window on his psyche. 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 Searcy is scheduled to be seen again in one week. He indicated that he will be safe during that time and can follow his safety plan.  List of Topics for Therapy Childhood  Dating  Career path Behavioral Activation Friends and social life   Put a pin in it topics: Autism-what does it mean for me Self Image/concept     Therapy Agreement Twice weekly therapy appointments  Continue with political interest on a regular basis  I agree to go to the emergency room or call 911 if thoughts of self harm worsen Continue recreational reading on a regular basis Go for regular walks If symptoms do not improve in 8 weeks as evidenced by a decreased BDI score, we will pursue more intensive treatment     Treatment Plan Client Abilities/Strengths Keigen attends appointments regularly and is able to articulate thoughts and experiences clearly. Client Treatment Preferences  Jsoeph prefers in-person appointments and will need appointments that fit with his school schedule.  Client Statement of Needs  Raeden reported that he needs help to manage suicidal ideation, and symptoms of depression and anxiety. Treatment Level  Weekly with reduced frequency as progress is made. Symptoms  Vomiting, suicidal ideation, feelings of sadness, loss of pleasure, lack of motivation, poor self image, inconsistent appetite (sometimes so sad he does not eat), fluctuations in sleep, stomach pain, sporadic leg pain, headaches several times weekly (takes prescription medication for this)  Problems Addressed  Suicidal  ideation, symptoms of depression and anxiety Goals 1. Kell would like to improve his overall mood   Objective Reduce frequency and intensity of suicidal ideation Target Date: 08/27/2024 Frequency: Weekly  Progress: 0 Modality: individual  Related Interventions Therapist will help Yoav to identify and disengage from negative thought patterns using CBT-based strategies Therapist will provide Antwaine with opportunities to process her experiences in session Therapist will provide Yaseen with additional resources as appropriate Therapist will provide Damarko with strategies to help her manage her emotions, such as deep breathing, meditation, mindfulness, and self care Therapist will engage Avraj in behavior activation, including pleasant events scheduling, incorporation of structure into his routine, and mastery experiences      [name], PhD

## 2024-02-24 ENCOUNTER — Ambulatory Visit: Payer: MEDICAID | Admitting: Clinical

## 2024-02-27 ENCOUNTER — Ambulatory Visit: Payer: MEDICAID | Admitting: Clinical

## 2024-02-27 DIAGNOSIS — F411 Generalized anxiety disorder: Secondary | ICD-10-CM

## 2024-02-27 NOTE — Progress Notes (Signed)
 Time: 9:00 am-9:57 am Diagnosis: F41.1 CPT Code: 90837P  Yordy was seen in person for individual therapy. Session consisted of continuing to try to identify and understand beliefs that shape his present life. 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 Katherine is scheduled to be seen again in one week. He indicated that he will be safe during that time and can follow his safety plan.  List of Topics for Therapy Childhood  Dating  Career path Behavioral Activation Friends and social life   Put a pin in it topics: Autism-what does it mean for me Self Image/concept     Therapy Agreement Twice weekly therapy appointments  Continue with political interest on a regular basis  I agree to go to the emergency room or call 911 if thoughts of self harm worsen Continue recreational reading on a regular basis Go for regular walks If symptoms do not improve in 8 weeks as evidenced by a decreased BDI score, we will pursue more intensive treatment     Treatment Plan Client Abilities/Strengths Keion attends appointments regularly and is able to articulate thoughts and experiences clearly. Client Treatment Preferences  Janari prefers in-person appointments and will need appointments that fit with his school schedule.  Client Statement of Needs  Tycen reported that he needs help to manage suicidal ideation, and symptoms of depression and anxiety. Treatment Level  Weekly with reduced frequency as progress is made. Symptoms  Vomiting, suicidal ideation, feelings of sadness, loss of pleasure, lack of motivation, poor self image, inconsistent appetite (sometimes so sad he does not eat), fluctuations in sleep, stomach pain, sporadic leg pain, headaches several times weekly (takes prescription  medication for this)  Problems Addressed  Suicidal ideation, symptoms of depression and anxiety Goals 1. Dinesh would like to improve his overall mood   Objective Reduce frequency and intensity of suicidal ideation Target Date: 08/27/2024 Frequency: Weekly  Progress: 0 Modality: individual  Related Interventions Therapist will help Kieren to identify and disengage from negative thought patterns using CBT-based strategies Therapist will provide Neeko with opportunities to process her experiences in session Therapist will provide Johnie with additional resources as appropriate Therapist will provide Caine with strategies to help her manage her emotions, such as deep breathing, meditation, mindfulness, and self care Therapist will engage Yonah in behavior activation, including pleasant events scheduling, incorporation of structure into his routine, and mastery experiences      [name], PhD               [name], PhD

## 2024-03-02 ENCOUNTER — Ambulatory Visit: Payer: MEDICAID | Admitting: Clinical

## 2024-03-05 ENCOUNTER — Ambulatory Visit: Payer: MEDICAID | Admitting: Clinical

## 2024-03-09 ENCOUNTER — Ambulatory Visit: Payer: MEDICAID | Admitting: Clinical

## 2024-03-12 ENCOUNTER — Ambulatory Visit: Payer: MEDICAID | Admitting: Clinical

## 2024-03-12 DIAGNOSIS — F411 Generalized anxiety disorder: Secondary | ICD-10-CM | POA: Diagnosis not present

## 2024-03-12 NOTE — Progress Notes (Signed)
 Time: 9:00 am-9:57 am Diagnosis: F41.1 CPT Code: 90837P  Korben was seen in person for individual therapy. Session included to continue to consider how relationships with family members and events from his childhood influence his current perspective. Reynald reflected upon a recent interaction during which his mother described him as too weird, and expressed wishes that he could just be normal. Therapist processed this with him, suggesting alternate perspectives and working with him to challenge the negative connotations of being different. Suicidal ideation without plan or intent was endorsed. Suvan is scheduled to be seen again in one week. He indicated that he will be safe during that time and can follow his safety plan.  List of Topics for Therapy Childhood  Dating  Career path Behavioral Activation Friends and social life   Put a pin in it topics: Autism-what does it mean for me Self Image/concept     Therapy Agreement Twice weekly therapy appointments  Continue with political interest on a regular basis  I agree to go to the emergency room or call 911 if thoughts of self harm worsen Continue recreational reading on a regular basis Go for regular walks If symptoms do not improve in 8 weeks as evidenced by a decreased BDI score, we will pursue more intensive treatment     Treatment Plan Client Abilities/Strengths Manford attends appointments regularly and is able to articulate thoughts and experiences clearly. Client Treatment Preferences  Amauri prefers in-person appointments and will need appointments that fit with his school schedule.  Client Statement of Needs  Manolito reported that he needs help to manage suicidal ideation, and symptoms of depression and anxiety. Treatment Level  Weekly with reduced frequency as progress is made. Symptoms  Vomiting, suicidal ideation, feelings of sadness, loss of pleasure, lack of motivation, poor self image, inconsistent appetite  (sometimes so sad he does not eat), fluctuations in sleep, stomach pain, sporadic leg pain, headaches several times weekly (takes prescription medication for this)  Problems Addressed  Suicidal ideation, symptoms of depression and anxiety Goals 1. Axzel would like to improve his overall mood   Objective Reduce frequency and intensity of suicidal ideation Target Date: 08/27/2024 Frequency: Weekly  Progress: 0 Modality: individual  Related Interventions Therapist will help Benford to identify and disengage from negative thought patterns using CBT-based strategies Therapist will provide Rhydian with opportunities to process her experiences in session Therapist will provide Yahel with additional resources as appropriate Therapist will provide Corney with strategies to help her manage her emotions, such as deep breathing, meditation, mindfulness, and self care Therapist will engage Kennard in behavior activation, including pleasant events scheduling, incorporation of structure into his routine, and mastery experiences       [name], PhD               [name], PhD

## 2024-03-16 ENCOUNTER — Ambulatory Visit: Payer: MEDICAID | Admitting: Clinical

## 2024-03-19 ENCOUNTER — Ambulatory Visit: Payer: MEDICAID | Admitting: Clinical

## 2024-03-19 DIAGNOSIS — F411 Generalized anxiety disorder: Secondary | ICD-10-CM | POA: Diagnosis not present

## 2024-03-19 NOTE — Progress Notes (Signed)
 Time: 12:00 pm-12:57 pm Diagnosis: F41.1 CPT Code: 09162E  Cory Lester was seen in person for individual therapy. Session focused primarily on his experience of dating so far. Therapist challenged and reframed several negative cognitions, offering alternate perspectives. He reported that his birthday is coming up and he is looking forward to possibly having cake and continuing to enjoy his book. Suicidal ideation without plan or intent was endorsed. Cory Lester is scheduled to be seen again in one week. He indicated that he will be safe during that time and can follow his safety plan.  List of Topics for Therapy Childhood  Dating  Career path Behavioral Activation Friends and social life   Put a pin in it topics: Autism-what does it mean for me Self Image/concept     Therapy Agreement Twice weekly therapy appointments  Continue with political interest on a regular basis  I agree to go to the emergency room or call 911 if thoughts of self harm worsen Continue recreational reading on a regular basis Go for regular walks If symptoms do not improve in 8 weeks as evidenced by a decreased BDI score, we will pursue more intensive treatment     Treatment Plan Client Abilities/Strengths Cory Lester attends appointments regularly and is able to articulate thoughts and experiences clearly. Client Treatment Preferences  Cory Lester prefers in-person appointments and will need appointments that fit with his school schedule.  Client Statement of Needs  Cory Lester reported that he needs help to manage suicidal ideation, and symptoms of depression and anxiety. Treatment Level  Weekly with reduced frequency as progress is made. Symptoms  Vomiting, suicidal ideation, feelings of sadness, loss of pleasure, lack of motivation, poor self image, inconsistent appetite (sometimes so sad he does not eat), fluctuations in sleep, stomach pain, sporadic leg pain, headaches several times weekly (takes prescription medication for  this)  Problems Addressed  Suicidal ideation, symptoms of depression and anxiety Goals 1. Cory Lester would like to improve his overall mood   Objective Reduce frequency and intensity of suicidal ideation Target Date: 08/27/2024 Frequency: Weekly  Progress: 0 Modality: individual  Related Interventions Therapist will help Cory Lester to identify and disengage from negative thought patterns using CBT-based strategies Therapist will provide Cory Lester with opportunities to process her experiences in session Therapist will provide Cory Lester with additional resources as appropriate Therapist will provide Cory Lester with strategies to help her manage her emotions, such as deep breathing, meditation, mindfulness, and self care Therapist will engage Cory Lester in behavior activation, including pleasant events scheduling, incorporation of structure into his routine, and mastery experiences      Cory LITTIE Ponto, PhD

## 2024-03-26 ENCOUNTER — Ambulatory Visit: Payer: MEDICAID | Admitting: Clinical

## 2024-03-26 DIAGNOSIS — F411 Generalized anxiety disorder: Secondary | ICD-10-CM

## 2024-03-26 NOTE — Progress Notes (Signed)
 Time: 8:00 am-8:57 am Diagnosis: F41.1 CPT Code: 90837P  Scottie was seen in person for individual therapy. 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 Session focused on noticing negative thought patterns driven by emotion. Therapist pointed out that the intensity of his emotion might make negative thoughts seem more accurate than they actually are, and this resonated with him. Suicidal ideation without plan or intent was endorsed, and he reported an increased frequency. Revan is scheduled to be seen again in one week. He indicated that he will be safe during that time and can follow his safety plan.  List of Topics for Therapy Childhood  Dating  Career path Behavioral Activation Friends and social life   Put a pin in it topics: Autism-what does it mean for me Self Image/concept     Therapy Agreement Twice weekly therapy appointments  Continue with political interest on a regular basis  I agree to go to the emergency room or call 911 if thoughts of self harm worsen Continue recreational reading on a regular basis Go for regular walks If symptoms do not improve in 8 weeks as evidenced by a decreased BDI score, we will pursue more intensive treatment     Treatment Plan Client Abilities/Strengths Alani attends appointments regularly and is able to articulate thoughts and experiences clearly. Client Treatment Preferences  Danzig prefers in-person appointments and will need appointments that fit with his school schedule.  Client Statement of Needs  Jaiden reported that he needs help to manage suicidal ideation, and symptoms of depression and anxiety. Treatment Level  Weekly with reduced frequency as progress is made. Symptoms  Vomiting, suicidal ideation, feelings of sadness, loss of pleasure, lack of motivation, poor self image, inconsistent appetite (sometimes so sad he does not eat), fluctuations in sleep,  stomach pain, sporadic leg pain, headaches several times weekly (takes prescription medication for this)  Problems Addressed  Suicidal ideation, symptoms of depression and anxiety Goals 1. Eliaz would like to improve his overall mood   Objective Reduce frequency and intensity of suicidal ideation Target Date: 08/27/2024 Frequency: Weekly  Progress: 0 Modality: individual  Related Interventions Therapist will help Kahiau to identify and disengage from negative thought patterns using CBT-based strategies Therapist will provide Gorje with opportunities to process her experiences in session Therapist will provide Xzaiver with additional resources as appropriate Therapist will provide Montrice with strategies to help her manage her emotions, such as deep breathing, meditation, mindfulness, and self care Therapist will engage Rynell in behavior activation, including pleasant events scheduling, incorporation of structure into his routine, and mastery experiences      [name], PhD               [name], PhD

## 2024-04-02 ENCOUNTER — Ambulatory Visit: Payer: MEDICAID | Admitting: Clinical

## 2024-04-09 ENCOUNTER — Ambulatory Visit: Payer: MEDICAID | Admitting: Clinical

## 2024-04-16 ENCOUNTER — Ambulatory Visit: Payer: MEDICAID | Admitting: Clinical

## 2024-04-23 ENCOUNTER — Ambulatory Visit: Payer: MEDICAID | Admitting: Clinical

## 2024-05-07 ENCOUNTER — Ambulatory Visit: Payer: MEDICAID | Admitting: Clinical

## 2024-05-14 ENCOUNTER — Ambulatory Visit: Payer: MEDICAID | Admitting: Clinical
